# Patient Record
Sex: Male | Born: 1935 | Race: White | Hispanic: No | Smoking: Never smoker
Health system: Southern US, Community
[De-identification: ages and names within clinical notes are randomized; demographics above are authoritative.]

## PROBLEM LIST (undated history)

## (undated) DIAGNOSIS — F429 Obsessive-compulsive disorder, unspecified: Secondary | ICD-10-CM

## (undated) DIAGNOSIS — Z950 Presence of cardiac pacemaker: Secondary | ICD-10-CM

## (undated) DIAGNOSIS — Z22322 Carrier or suspected carrier of Methicillin resistant Staphylococcus aureus: Secondary | ICD-10-CM

## (undated) DIAGNOSIS — F0390 Unspecified dementia without behavioral disturbance: Secondary | ICD-10-CM

## (undated) DIAGNOSIS — I442 Atrioventricular block, complete: Secondary | ICD-10-CM

## (undated) DIAGNOSIS — I10 Essential (primary) hypertension: Secondary | ICD-10-CM

## (undated) DIAGNOSIS — R159 Full incontinence of feces: Secondary | ICD-10-CM

## (undated) HISTORY — PX: HERNIA REPAIR: SHX51

## (undated) HISTORY — DX: Obsessive-compulsive disorder, unspecified: F42.9

## (undated) HISTORY — DX: Atrioventricular block, complete: I44.2

## (undated) HISTORY — DX: Essential (primary) hypertension: I10

## (undated) HISTORY — DX: Unspecified dementia, unspecified severity, without behavioral disturbance, psychotic disturbance, mood disturbance, and anxiety: F03.90

## (undated) HISTORY — DX: Full incontinence of feces: R15.9

## (undated) HISTORY — DX: Presence of cardiac pacemaker: Z95.0

## (undated) HISTORY — DX: Carrier or suspected carrier of methicillin resistant Staphylococcus aureus: Z22.322

---

## 2007-10-04 ENCOUNTER — Ambulatory Visit: Payer: Self-pay | Admitting: Internal Medicine

## 2007-10-04 ENCOUNTER — Inpatient Hospital Stay (HOSPITAL_COMMUNITY): Admission: AD | Admit: 2007-10-04 | Discharge: 2007-10-09 | Payer: Self-pay | Admitting: Internal Medicine

## 2007-10-05 ENCOUNTER — Encounter: Payer: Self-pay | Admitting: Internal Medicine

## 2007-10-05 HISTORY — PX: OTHER SURGICAL HISTORY: SHX169

## 2007-10-29 ENCOUNTER — Ambulatory Visit: Payer: Self-pay

## 2008-01-07 ENCOUNTER — Ambulatory Visit: Payer: Self-pay | Admitting: Internal Medicine

## 2008-09-23 ENCOUNTER — Ambulatory Visit: Payer: Self-pay | Admitting: Internal Medicine

## 2008-12-26 IMAGING — CR DG CHEST 2V
2 series · 2 of 2 positions shown · non-contrast
Comparison: None.

CLINICAL DATA: Heart block.
 CHEST ? 2 VIEW:

[w chest pa]
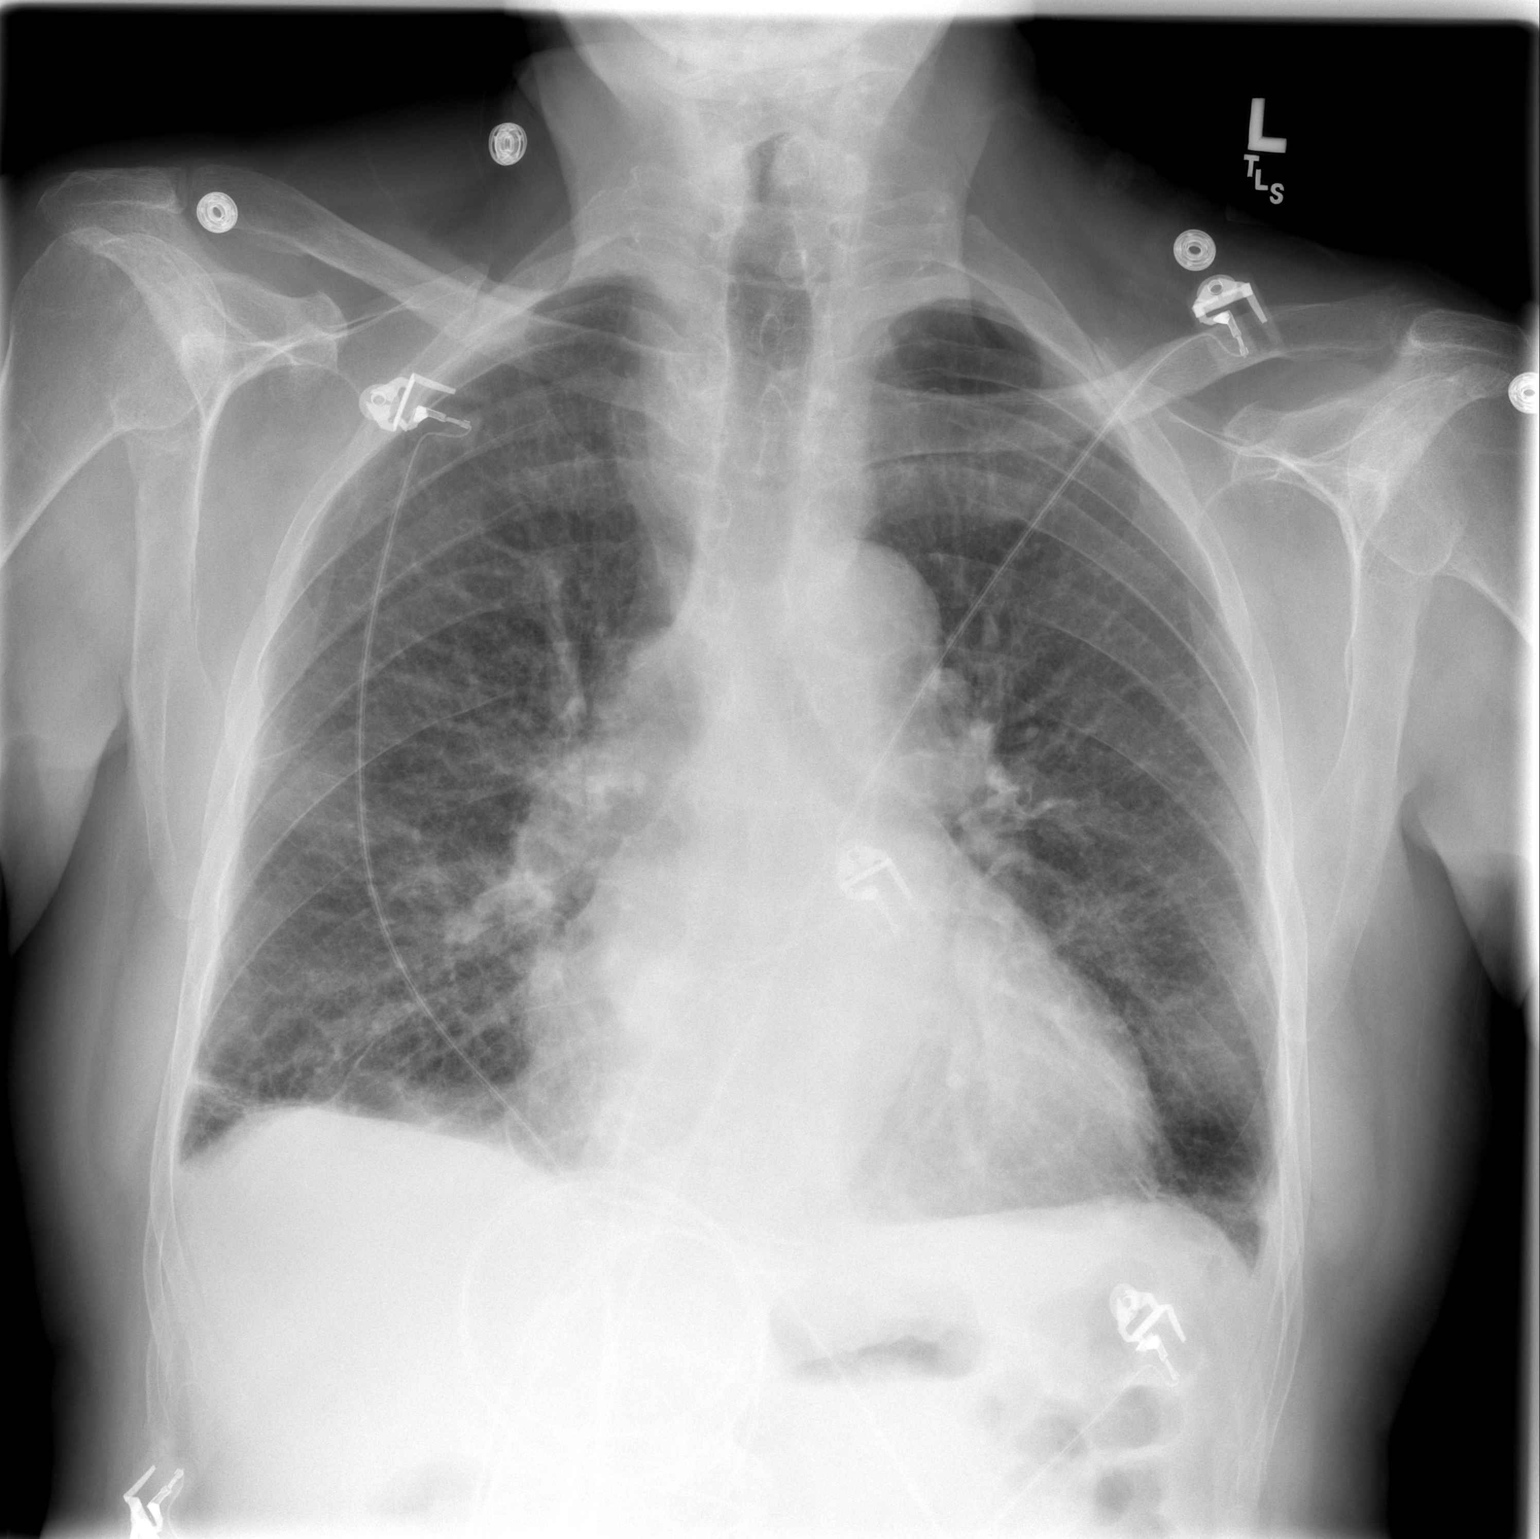

[w chest lat]
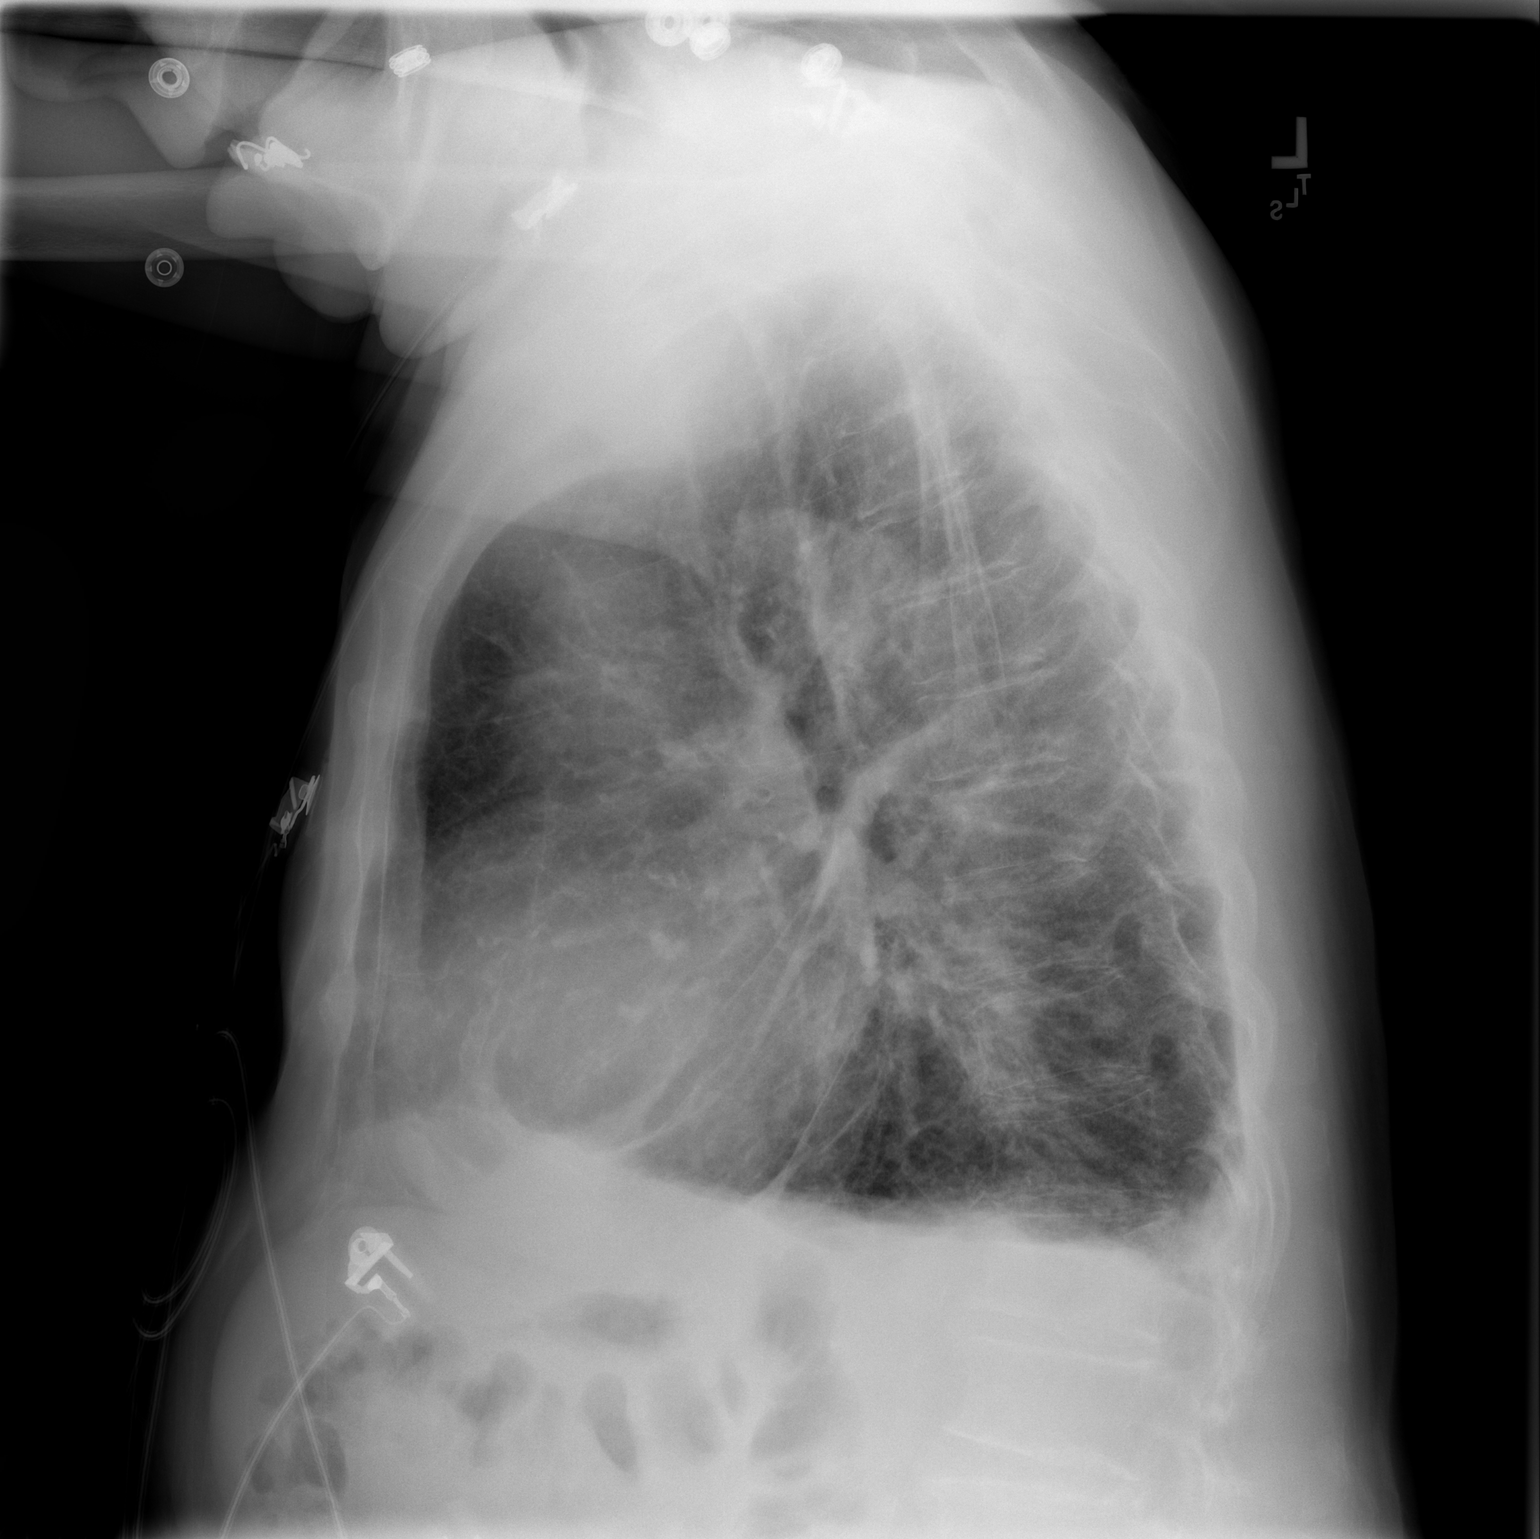

[2 of 2 positions shown; findings below may reference images not displayed]

FINDINGS: The heart is moderately enlarged.  Vascular congestion and interstitial edema are present.  Small bilateral effusions are present.  Right paratracheal density is noted, which may simply represent ectatic vasculature.  There are compression deformities at T12 and L1 of indeterminate age with osteopenia.  No pneumothorax.
IMPRESSION: 1.  Cardiomegaly with interstitial edema. 
 2.  Right paratracheal density, likely ectatic vasculature.  Comparison with prior films is recommended.  If not available, CT can be performed. 
 3.  Small bilateral effusions. 
 4.  T12 and L1 compression deformities of indeterminate age.

## 2008-12-31 ENCOUNTER — Ambulatory Visit: Payer: Self-pay | Admitting: Internal Medicine

## 2009-02-06 ENCOUNTER — Encounter (INDEPENDENT_AMBULATORY_CARE_PROVIDER_SITE_OTHER): Payer: Self-pay | Admitting: *Deleted

## 2009-04-02 DIAGNOSIS — I10 Essential (primary) hypertension: Secondary | ICD-10-CM | POA: Insufficient documentation

## 2009-04-02 DIAGNOSIS — I442 Atrioventricular block, complete: Secondary | ICD-10-CM

## 2009-04-02 DIAGNOSIS — A4902 Methicillin resistant Staphylococcus aureus infection, unspecified site: Secondary | ICD-10-CM | POA: Insufficient documentation

## 2009-04-14 ENCOUNTER — Ambulatory Visit: Payer: Self-pay | Admitting: Internal Medicine

## 2009-07-14 ENCOUNTER — Ambulatory Visit: Payer: Self-pay | Admitting: Internal Medicine

## 2009-07-15 ENCOUNTER — Encounter: Payer: Self-pay | Admitting: Internal Medicine

## 2009-07-20 ENCOUNTER — Encounter: Payer: Self-pay | Admitting: Internal Medicine

## 2009-10-06 ENCOUNTER — Ambulatory Visit: Payer: Self-pay | Admitting: Internal Medicine

## 2009-10-06 DIAGNOSIS — Z950 Presence of cardiac pacemaker: Secondary | ICD-10-CM | POA: Insufficient documentation

## 2010-01-06 ENCOUNTER — Ambulatory Visit: Payer: Self-pay | Admitting: Internal Medicine

## 2010-01-19 ENCOUNTER — Encounter: Payer: Self-pay | Admitting: Internal Medicine

## 2010-04-07 ENCOUNTER — Ambulatory Visit: Payer: Self-pay | Admitting: Internal Medicine

## 2010-04-22 ENCOUNTER — Encounter: Payer: Self-pay | Admitting: Internal Medicine

## 2010-06-30 ENCOUNTER — Ambulatory Visit: Payer: Self-pay | Admitting: Internal Medicine

## 2010-09-30 ENCOUNTER — Ambulatory Visit: Payer: Self-pay | Admitting: Internal Medicine

## 2010-11-02 ENCOUNTER — Encounter (INDEPENDENT_AMBULATORY_CARE_PROVIDER_SITE_OTHER): Payer: Self-pay | Admitting: *Deleted

## 2010-11-23 NOTE — Cardiovascular Report (Signed)
Summary: Office Visit Remote   Office Visit Remote   Imported By: Roderic Ovens 01/21/2010 11:50:12  _____________________________________________________________________  External Attachment:    Type:   Image     Comment:   External Document

## 2010-11-23 NOTE — Assessment & Plan Note (Signed)
Summary: 9 mo f/u   CC:  9 month f/u.  History of Present Illness: Warren Ritter returns today for followup.  He is a pleasant 77 yo man with a h/o CHB, s/p PPM, HTN and dementia.  He has been stable over the last 9 months denying syncope or sob or c/p.  His daughter who is with him today notes that his appetite is good and he follows her around constantly.  No peripheral edema or c/p.  Current Medications (verified): 1)  Aspirin 81 Mg Tbec (Aspirin) .... Take One Tablet By Mouth Daily 2)  Multivitamins  Tabs (Multiple Vitamin) .Marland Kitchen.. 1 By Mouth Once Daily 3)  Amlodipine Besylate 5 Mg Tabs (Amlodipine Besylate) .... Take One Tablet By Mouth Daily  Allergies (verified): 1)  ! * Benzoiazepine  Past History:  Past Medical History: Last updated: 04/02/2009 HYPERTENSION, BORDERLINE (ICD-401.9) ATRIOVENTRICULAR BLOCK, COMPLETE (ICD-426.0) MRSA ([id number])  Past Surgical History: Last updated: 04/02/2009  implantation of a Medtronic dual- chamber pacemaker  10/05/2007  Doylene Canning. Ladona Ridgel, MD  Review of Systems  The patient denies chest pain, syncope, dyspnea on exertion, and peripheral edema.    Vital Signs:  Patient profile:   77 year old male Height:      68 inches Weight:      168 pounds BMI:     25.64 Pulse rate:   64 / minute BP sitting:   138 / 60  (right arm) Cuff size:   regular  Vitals Entered By: Judithe Modest CMA (June 30, 2010 11:10 AM)  Physical Exam  General:  Well developed, well nourished, in no acute distress. Head:  normocephalic and atraumatic Eyes:  PERRLA/EOM intact; conjunctiva and lids normal. Mouth:  Teeth, gums and palate normal. Oral mucosa normal. Neck:  Neck supple, no JVD. No masses, thyromegaly or abnormal cervical nodes. Chest Wall:  Well healed PPM incision. Lungs:  Clear bilaterally to auscultation with no wheezes, rales, or rhonchi. Heart:  RRR with normal S1 and S2.  PMI is not enlarged or laterally displaced.  No murmur. Abdomen:   Bowel sounds positive; abdomen soft and non-tender without masses, organomegaly, or hernias noted. No hepatosplenomegaly. Msk:  Back normal, normal gait. Muscle strength and tone normal. Pulses:  pulses normal in all 4 extremities Extremities:  No clubbing or cyanosis. Neurologic:  Alert and oriented x 3.   PPM Specifications Following MD:  Lewayne Bunting, MD     PPM Vendor:  Medtronic     PPM Model Number:  [id number]     PPM Serial Number:  [id number] PPM DOI:  10/05/2007      Lead 1    Location: RA     DOI: 10/05/2007     Model #: [id number]     Serial #: [id number]     Status: active Lead 2    Location: RV     DOI: 10/05/2007     Model #: [id number]     Serial #: [id number]     Status: active   Indications:  CHB   PPM Follow Up Remote Check?  No Battery Voltage:  2.78 V     Battery Est. Longevity:  6 years     Pacer Dependent:  Yes       PPM Device Measurements Atrium  Amplitude: 2.0 mV, Impedance: 482 ohms, Threshold: 0.625 V at 0.4 msec Right Ventricle  Impedance: 484 ohms, Threshold: 0.5 V at 0.4 msec  Episodes MS Episodes:  0     Percent Mode Switch:  0     Coumadin:  No Ventricular High Rate:  3     Atrial Pacing:  96.7%     Ventricular Pacing:  99.8%  Parameters Mode:  DDDR     Lower Rate Limit:  60     Upper Rate Limit:  130 Paced AV Delay:  200     Sensed AV Delay:  180 Next Remote Date:  09/30/2010     Next Cardiology Appt Due:  06/25/2011 Tech Comments:  No parameter changes. Device function normal.  [facility] transmissions every 3 months.  ROV 1 year with Dr. Ladona Ridgel. Altha Harm, LPN  June 30, 2010 11:48 AM  MD Comments:  Agree with above.  Impression & Recommendations:  Problem # 1:  CARDIAC PACEMAKER IN SITU (ICD-V45.01) His device is working normally.  Underlying rhythm is CHB.  No escape.  Problem # 2:  HYPERTENSION, BORDERLINE (ICD-401.9) His blood pressure is improved from my last visit.  He will continue meds as below and a low sodium diet. His updated medication  list for this problem includes:    Aspirin 81 Mg Tbec (Aspirin) .Marland Kitchen... Take one tablet by mouth daily    Amlodipine Besylate 5 Mg Tabs (Amlodipine besylate) .Marland Kitchen... Take one tablet by mouth daily  Orders: EKG w/ Interpretation (93000)  Patient Instructions: 1)  Your physician recommends that you schedule a follow-up appointment in: YEAR WITH DR Ladona Ridgel 2)  Your physician recommends that you continue on your current medications as directed. Please refer to the Current Medication list given to you today.

## 2010-11-23 NOTE — Letter (Signed)
Summary: Remote Device Check  [employer], Main Office  [address]   [place], [place] [address]   Phone: [phone]  Fax: [phone]     January 19, 2010 MRN: [id number]   Warren Ritter [address] [place], [place]  [address]   Dear Mr. [facility],   Your remote transmission was recieved and reviewed by your physician.  All diagnostics were within normal limits for you.  __X___Your next transmission is scheduled for:   April 07, 2010.  Please transmit at any time this day.  If you have a wireless device your transmission will be sent automatically.     Sincerely,  Proofreader

## 2010-11-23 NOTE — Cardiovascular Report (Signed)
Summary: Office Visit   Office Visit   Imported By: Roderic Ovens 07/06/2010 12:00:19  _____________________________________________________________________  External Attachment:    Type:   Image     Comment:   External Document

## 2010-11-23 NOTE — Letter (Signed)
Summary: Remote Device Check  [employer], Main Office  [address]   [place], [place] [address]   Phone: [phone]  Fax: [phone]     April 22, 2010 MRN: [id number]   Warren Ritter [address] [place], [place]  [address]   Dear Mr. [facility],   Your remote transmission was recieved and reviewed by your physician.  All diagnostics were within normal limits for you.   __X____Your next office visit is scheduled for: September 2011 with Dr Ladona Ridgel.                               Please call our office to schedule an appointment.    Sincerely,  Vella Kohler

## 2010-11-23 NOTE — Cardiovascular Report (Signed)
Summary: Office Visit Remote   Office Visit Remote   Imported By: Roderic Ovens 04/23/2010 16:35:35  _____________________________________________________________________  External Attachment:    Type:   Image     Comment:   External Document

## 2010-11-25 NOTE — Letter (Signed)
Summary: Remote Device Check  [employer], Main Office  [address]   [place], [place] [address]   Phone: [phone]  Fax: [phone]     November 02, 2010 MRN: [id number]   [facility] [facility] [address] [place], [place]  [address]   Dear Mr. [facility],   Your remote transmission was recieved and reviewed by your physician.  All diagnostics were within normal limits for you.  __X___Your next transmission is scheduled for:   12-30-10.  Please transmit at any time this day.  If you have a wireless device your transmission will be sent automatically.   Sincerely,  Vella Kohler

## 2010-11-25 NOTE — Cardiovascular Report (Signed)
Summary: Office Visit Remote   Office Visit Remote   Imported By: Roderic Ovens 11/05/2010 12:06:37  _____________________________________________________________________  External Attachment:    Type:   Image     Comment:   External Document

## 2010-12-30 ENCOUNTER — Encounter (INDEPENDENT_AMBULATORY_CARE_PROVIDER_SITE_OTHER): Payer: Medicare Other

## 2010-12-30 ENCOUNTER — Encounter: Payer: Self-pay | Admitting: Internal Medicine

## 2010-12-30 DIAGNOSIS — I495 Sick sinus syndrome: Secondary | ICD-10-CM

## 2011-01-06 ENCOUNTER — Encounter: Payer: Self-pay | Admitting: *Deleted

## 2011-01-11 NOTE — Cardiovascular Report (Signed)
Summary: Office Visit   Office Visit   Imported By: Roderic Ovens 01/07/2011 14:44:55  _____________________________________________________________________  External Attachment:    Type:   Image     Comment:   External Document

## 2011-01-11 NOTE — Letter (Signed)
Summary: Remote Device Check  [facility] [employer], Main Office  [address]   [place], [place] [address]   Phone: [phone]  Fax: [phone]     January 06, 2011 MRN: [id number]   JACORY KAMEL [address] [place], [place]  [address]   Dear Mr. [facility],   Your remote transmission was recieved and reviewed by your physician.  All diagnostics were within normal limits for you.  __X___Your next transmission is scheduled for:  03/31/11.  Please transmit at any time this day.  If you have a wireless device your transmission will be sent automatically.  ______Your next office visit is scheduled for:                              . Please call our office to schedule an appointment.    Sincerely,  Altha Harm, LPN

## 2011-01-31 ENCOUNTER — Encounter: Payer: Self-pay | Admitting: Family Medicine

## 2011-01-31 NOTE — Progress Notes (Signed)
Subjective:    Patient ID: Warren Ritter, male    DOB: [birth year], 77 y.o.   MRN: [id number]  Diarrhea  This is a recurrent problem. The current episode started more than 1 month ago. The problem occurs 2 to 4 times per day. The problem has been waxing and waning. The stool consistency is described as watery. The patient states that diarrhea does not awaken him from sleep. Associated symptoms include weight loss. Pertinent negatives include no abdominal pain, arthralgias, bloating, chills, coughing, fever, headaches, increased  flatus, myalgias, sweats, URI or vomiting. Exacerbated by: medications: namenda and Citalopram. Risk factors include no known risk factors. He has tried anti-motility drug for the symptoms. The treatment provided moderate relief. There is no history of bowel resection, inflammatory bowel disease, irritable bowel syndrome, malabsorption, a recent abdominal surgery or short gut syndrome.    Past Medical History  Diagnosis Date  . Dementia   . Stool incontinence   . Hypertension   . OCD (obsessive compulsive disorder)   . Cardiac pacemaker   . Heart block AV third degree    Past Surgical History  Procedure Date  . Hernia repair     inguinal   History   Social History  . Marital Status: Widowed    Spouse Name: N/A    Number of Children: N/A  . Years of Education: N/A   Occupational History  . Not on file.   Social History Main Topics  . Smoking status: Never Smoker   . Smokeless tobacco: Not on file  . Alcohol Use: No  . Drug Use: No  . Sexually Active:    Other Topics Concern  . Not on file   Social History Narrative  . No narrative on file   No family history on file. No current outpatient prescriptions on file prior to visit.   No Known Allergies  There is no immunization history on file for this patient. Prior to Admission medications   Medication Sig Start Date End Date Taking? Authorizing Provider  amLODipine (NORVASC) 5 MG tablet Take 5  mg by mouth daily.     Yes Historical Provider, MD  aspirin 81 MG tablet Take 81 mg by mouth daily.     Yes Historical Provider, MD  multivitamin (ONE-A-DAY MEN'S) TABS Take 1 tablet by mouth daily.     Yes Historical Provider, MD    Review of Systems  Constitutional: Positive for weight loss and unexpected weight change. Negative for fever, chills, diaphoresis, activity change, appetite change and fatigue.  HENT: Negative.   Eyes: Negative.   Respiratory: Negative for cough.   Cardiovascular: Negative.   Gastrointestinal: Positive for diarrhea. Negative for nausea, vomiting, abdominal pain, constipation, blood in stool, abdominal distention, anal bleeding, rectal pain, bloating and flatus.  Musculoskeletal: Negative for myalgias and arthralgias.  Neurological: Negative for headaches.  Psychiatric/Behavioral: Positive for behavioral problems, confusion and decreased concentration. Negative for suicidal ideas, hallucinations, sleep disturbance, self-injury and dysphoric mood. The patient is hyperactive. The patient is not nervous/anxious.        [OCD was treated at mental health but daughter had to stop the citalopram, and the namenda due to diarrhea. Imodium-AD helped the diarrhea and  Today it is slowed down and better.  BP 123/65  Pulse 65  Temp 97.5 F (36.4 C)  Wt 157 lb (71.215 kg)    Objective:   Physical Exam  Constitutional: Vital signs are normal. He is cooperative.  Non-toxic appearance. He does not have a sickly  appearance. He does not appear ill. No distress.       Quiet man  HENT:  Head: Normocephalic and atraumatic.  Right Ear: External ear normal.  Left Ear: External ear normal.  Nose: Nose normal.  Mouth/Throat: Oropharynx is clear and moist.  Eyes: Pupils are equal, round, and reactive to light.  Neck: Normal range of motion. Neck supple. No tracheal deviation present. No thyromegaly present.  Cardiovascular: Normal heart sounds.   Pulmonary/Chest: Effort normal  and breath sounds normal.  Abdominal: Soft. Bowel sounds are normal. He exhibits no distension and no mass. There is no tenderness. There is no rebound and no guarding.  Musculoskeletal: Normal range of motion.  Lymphadenopathy:    He has no cervical adenopathy.  Neurological: He is alert. He has normal strength. No cranial nerve deficit or sensory deficit. He displays a negative Romberg sign.       Oriented to name, only. Mini mental status exam still 15/30  Skin: Skin is warm, dry and intact. He is not diaphoretic.  Psychiatric: His speech is normal. His mood appears not anxious. His affect is not angry, not blunt, not labile and not inappropriate. He is withdrawn. He is not agitated and not slowed. Thought content is not delusional. Cognition and memory are impaired. He expresses inappropriate judgment. He does not exhibit a depressed mood. He expresses no suicidal plans and no homicidal plans. He exhibits abnormal recent memory and abnormal remote memory.          Assessment & Plan:  1. Diarrhea, improved with discontinuation ofmedications. 2. OCD type behavior, treated at mental health 3. Dementia : diarrhea with namenda and citalopram. Will hold for  3 weeks. Daughter to call and consider at that time Exelon patches. 4. Stool incontinence. Evaluation by GI. Referral was made await evaluation. 5. Weight loss. Due to diarrhea.  Additional Plans: Patient has appointment this week with mental health. Increase po intake calories & meals. Home health intervention_ declined by daughter for now. Sign consent at Mental Health to share information. RTC x 6 weeks.

## 2011-03-07 ENCOUNTER — Other Ambulatory Visit: Payer: Self-pay | Admitting: Internal Medicine

## 2011-03-08 NOTE — H&P (Signed)
NAME:  Warren Ritter, LAMERE NO.:  [id number]   MEDICAL RECORD NO.:  [id number]          PATIENT TYPE:  INP   LOCATION:  3714                         FACILITY:  [facility]   PHYSICIAN:  Pricilla Riffle, MD, FACCDATE OF BIRTH:  02-24-1926   DATE OF ADMISSION:  10/04/2007  DATE OF DISCHARGE:                              HISTORY & PHYSICAL   IDENTIFICATION:  Warren Ritter is a 77 year old who we were asked to see by  Lindaann Pascal, Dr. Kathi Der office, for heart block.   HISTORY OF PRESENT ILLNESS:  The patient has no known history of cardiac  problems prior.  He was seen by Lindaann Pascal on Monday, December 8th, for  a cold.  EKG was done and it showed complete heart block at a rate of 42  beats per minute.  The patient denies any presyncope, syncope, no  shortness of breath, no chest pain, no PND, no fevers, chills.  He has a  nonproductive cough and given zMax x1 on Monday.   ALLERGIES:  None.   MEDICATIONS:  None.   PAST MEDICAL HISTORY:  1. MRSA of the right elbow after a fall in 2006.  2. Hernia surgery in 2000.   SOCIAL HISTORY:  Does not smoke, does not drink, widowed.   FAMILY HISTORY:  Brothers x3 with pacemakers.  Two other brothers alive  now.   REVIEW OF SYSTEMS:  Negative except for one spell this summer.  He  became weak and clammy while mowing the lawn.  He sat down for 20  minutes, okay.  Otherwise, again systems negative to all problems except  as noted above.   PHYSICAL EXAMINATION:  GENERAL:  The patient is in no acute distress.  VITAL SIGNS:  Blood pressure 164/72 (white coat per daughter), pulse is  40 and regular, weight 184.  HEENT:  Normocephalic atraumatic.  EOMI.  PERRL.  Mucous membranes  moist.  NECK:  No bruits, Cannon A waves.  JVP is normal.  LUNGS:  Clear.  No rales or wheezes.  Some rhonchi that clears with  cough.  CARDIAC:  Regular rate and rhythm.  S1 S2.  No S3 or murmurs.  ABDOMEN:  Supple, nontender, no hepatomegaly, no masses, normal  bowel  sounds.  EXTREMITIES:  No edema.  Pulses 2 plus.   A 12-lead EKG shows complete heart block with a ventricular rate of 40  beats per minute.  Right bundle morphology with QRS of 138 milliseconds.   IMPRESSION:  The patient is a 77 year old gentleman with complete heart  block.  He is relatively asymptomatic with EKG and increased QRS.   We will admit to [facility] for a pacemaker.  Daughter will  take since he has been asymptomatic.  Check admission labs, check  thyroid, chest x-ray on arrival, though it sounds like he had just had a  bronchitis.      Pricilla Riffle, MD, [facility]  Electronically Signed     PVR/MEDQ  D:  10/04/2007  T:  10/04/2007  Job:  [id number]

## 2011-03-08 NOTE — Assessment & Plan Note (Signed)
[facility]                         ELECTROPHYSIOLOGY OFFICE NOTE   BERNARDINO, DOWELL                         MRN:          [id number]  DATE:01/07/2008                            DOB:          February 20, 1926    [name] returns today for follow up.  He is a very pleasant elderly  male with a history of symptomatic bradycardia who has undergone  permanent pacemaker insertion back in December.  He returns today for  follow up.  In the interim, he has been stable.  Of course, in the  hospital he had a reaction to benzodiazepines which eventually resolved.  He denied chest pain or shortness of breath today.  He had no specific  complaints.   PHYSICAL EXAMINATION:  GENERAL:  He is a pleasant, well-appearing  elderly man in no acute distress.  VITAL SIGNS:  The blood pressure today was 140/64, the pulse 80 and  regular, respirations were 18.  The weight was 177 pounds.  NECK:  No jugular distention.  LUNGS:  Clear bilaterally to auscultation.  No wheezes, rales or rhonchi  are present.  CARDIOVASCULAR:  Regular rate and rhythm with normal S1-S2.  The  pacemaker insertion site was healed up very nicely.  EXTREMITIES:  No  cyanosis, clubbing or edema.   MEDICATIONS:  1. Aspirin 81 a day.  2. Multivitamin.   IMPRESSION:  1. Symptomatic bradycardia.  2. Status post pacemaker insertion.   DISCUSSION:  Overall, Mr. Cantera is doing amazingly well.  He denies  specific complaints today.  His pacemaker is working normally.   His P and R waves were 4 and 8 respectively.  The impedance is 630 and  500 respectively in the A and the V.  Threshold 0.75 at 0.4 in the A and  0.5 at 0.4 in the RV.  The battery voltage was 2.79 volts.  I will plan  to see the patient back in the office in 9 months, sooner should he have  additional problems or concerns.  Today, we turned his outputs down to  maximize battery longevity.     Doylene Canning. Ladona Ridgel, MD  Electronically  Signed    GWT/MedQ  DD: 01/07/2008  DT: 01/07/2008  Job #: [id number]   cc:   Ernestina Penna, M.D.

## 2011-03-08 NOTE — Op Note (Signed)
NAMETARANCE, BALAN NO.:  [id number]   MEDICAL RECORD NO.:  [id number]          PATIENT TYPE:  INP   LOCATION:  3714                         FACILITY:  [facility]   PHYSICIAN:  Doylene Canning. Ladona Ridgel, MD    DATE OF BIRTH:  25-Mar-1926   DATE OF PROCEDURE:  10/05/2007  DATE OF DISCHARGE:                               OPERATIVE REPORT   PROCEDURE PERFORMED:  Dual-chamber pacemaker implantation.   INDICATIONS:  Complete heart block.   INTRODUCTION:  The patient is a 77 year old male with a history of  complete heart block who was admitted to the hospital with additional  evaluation.  He had a wide QRS escape.   DESCRIPTION OF PROCEDURE:  After informed consent was obtained, the  patient was taken to the diagnostic EP lab in the fasting state.  After  the usual preparation and draping, intravenous fentanyl and midazolam  was given for sedation.  30 mL of lidocaine was infiltrated in the left  infraclavicular region.  A 5-cm incision was carried out over this  region, electrocautery was utilized to dissect down to the fascial  plane.  At this point, the patient became quite combative and we had to  further sedate him with Valium and fentanyl. Having accomplished this,  10 mL of contrast was injected into the left upper extremity venous  system demonstrating a patent left subclavian vein.  This was carried  out after several attempts to find the vein were unsuccessful.  The left  subclavian vein was then punctured x2 and the Medtronic model 5076, 58-  cm active fixation pacing lead serial number [id number] was advanced  into the right ventricle.  Medtronic model [id number], 52-cm active fixation  pacing lead serial number [id number] was then advanced into the right  atrium.  Mapping was carried out in the right ventricle for a final site  on the RV apical septum. The R-waves measured 9 and the impedance was  930 ohms, threshold 0.7 volts at 0.5 milliseconds.  10 volt pacing did  not  stimulate the diaphragm.  With the ventricular lead in satisfactory  position, attention then turned to placement of the atrial lead and was  placed in the anterolateral wall of the right atrium where P-waves were  4 and the impedance 571 ohms, the threshold a volt at 0.5 and there is a  nice injury current with active fixation.  Again 10 volts pacing did not  stimulate the diaphragm.  With both atrial and ventricular lead in  satisfactory position, they were secured to the subpectoralis fascia  with figure-of-eight silk suture.  The sewing sleeve was also secured  with silk suture.  Electrocautery was then utilized to make a  subcutaneous pocket.  Kanamycin irrigation was utilized to irrigate the  pocket and electrocautery was utilized to assure hemostasis.  The  Medtronic Sensia dual chamber pacemaker serial number [id number] was  connected to the atrial and ventricular lead and placed back in the  subcutaneous pocket. The generator was secured with silk suture.  Additional kanamycin was utilized to irrigate the pocket and the  incision closed with a layer of 2-0 Vicryl followed by a layer of 3-0  Vicryl. Benzoin was painted on the skin, Steri-Strips were applied, a  pressure dressing was placed and the patient was returned to his room in  satisfactory condition.   COMPLICATIONS:  There were no immediate procedure complications.   RESULTS:  This demonstrates successful implantation of a Medtronic dual-  chamber pacemaker in a patient with complete heart block.      Doylene Canning. Ladona Ridgel, MD  Electronically Signed     GWT/MEDQ  D:  10/05/2007  T:  10/06/2007  Job:  [id number]   cc:   Ernestina Penna, M.D.

## 2011-03-08 NOTE — Assessment & Plan Note (Signed)
[facility]                         ELECTROPHYSIOLOGY OFFICE NOTE   IVAN, LACHER                         MRN:          [id number]  DATE:09/23/2008                            DOB:          November 13, 1925    Mr. Yeley returns today for followup.  He is a very pleasant elderly  male with a history of complete heart block status post pacemaker  insertion.  He had no specific complaints today.  His daughter is with  him and states that he has become fairly sedentary.  She thinks he is  concerned that activity might mess up his pacemaker.  No other  complaints today.   His medicines include aspirin 81 a day and a multivitamin.   PHYSICAL EXAMINATION:  GENERAL:  He is a pleasant well-appearing elderly  man in no distress.  Blood pressure today was 150/80, the pulse 80 and  regular, the respirations were 18, weight was 174 pounds.  NECK:  No jugular venous distention.  LUNGS:  Clear bilaterally to auscultation.  No wheezes, rales, or  rhonchi are present.  There is no increased work of breathing.  CARDIOVASCULAR:  Regular rate and rhythm.  Normal S1 and S2.  No  murmurs, rubs, or gallops.  LUNGS:  Clear bilaterally to auscultation.  No wheezes, rales, or  rhonchi are present.  ABDOMEN:  Soft, nontender.  EXTREMITIES:  No edema.   Interrogation of the pacemaker demonstrates a Medtronic [place].  P-  waves are greater than 2.8.  There are no R-waves secondary to complete  heart block.  The patient impedance was 590 in A, 559 in the V.  The  threshold was 0.5 at 0.4 in the A and 0.75 at 0.4 in the RV.  The  battery voltage was 2.79 volts.  He was 99.9% V-paced, 74% A-paced.   IMPRESSION:  1. Symptomatic bradycardia.  2. Complete heart block.  3. Status post pacemaker insertion.   DISCUSSION:  Mr. Alyse Low is stable.  His pacemaker is working normally.  We will see him back for pacemaker followup in 1 year.     Doylene Canning. Ladona Ridgel, MD  Electronically  Signed    GWT/MedQ  DD: 09/23/2008  DT: 09/24/2008  Job #: [id number]   cc:   Ernestina Penna, M.D.

## 2011-03-08 NOTE — Discharge Summary (Signed)
NAMEHARRIE, Warren Ritter NO.:  [id number]   MEDICAL RECORD NO.:  [id number]          PATIENT TYPE:  INP   LOCATION:  4715                         FACILITY:  [facility]   PHYSICIAN:  Maple Mirza, PA   DATE OF BIRTH:  19-Aug-1926   DATE OF ADMISSION:  10/04/2007  DATE OF DISCHARGE:  10/09/2007                               DISCHARGE SUMMARY   ALLERGIES:  NO KNOWN DRUG ALLERGIES.   This dictation and exam greater than 35 minutes.   FINAL DIAGNOSES:  1. Discharging day number 4 status post implant of a Medtronic SENSIA      dual-chamber permanent pacemaker, Dr. Lewayne Bunting.  2. Complete heart block with symptoms of fatigue only.  No syncope or      presyncope.  3. Borderline hypertension this admission, patient discharging on no      antihypertensives.  4. Delirium postprocedure clearing after 40 hours of active sedation      in the cardiac intensive care unit.   SECONDARY DIAGNOSES:  1. Methicillin-resistant Staphylococcus aureus of the right elbow      after a fall 2006.  2. Hernia surgery 2000.   PROCEDURE:  October 05, 2007, implant of Medtronic SENSIA dual-chamber  pacemaker.  The patient has had no hematoma post-procedurally.  The  device has been interrogated.  All values within normal limits.  Mobility of the left arm and incision care have been discussed with the  patient and his daughter prior to discharge.   BRIEF HISTORY:  Mr. Rowand is a 77 year old male referred by Dr. Kathi Der  office for heart block.   The patient has no known history of cardiac problems.  He was seen by  Lindaann Pascal Monday, October 01, 2007.  Electrocardiogram was done and  showed complete heart block with rate of 42 beats per minute.  The  patient denied any presyncopal or syncopal symptoms.  He was not short  of breath.  He had no chest pain.  No paroxysmal nocturnal dyspnea.  He  is not having fevers or chills.  He has a nonproductive cough and has  been given a Z-Pak.   HOSPITAL COURSE:  The patient was admitted from the [facility]  Office on [facility] October 04, 2007 after being seen by Dr. Dietrich Pates and with discovery of complete  heart block.  The patient was seen  at [facility] by Dr. Lewayne Bunting.  The risks, benefits, goals  and expectation of pacemaker have been described to the patient and he  wishes to proceed.  This procedure was done on October 05, 2007.  A  Medtronic dual-chamber pacemaker was implanted.  The patient was  confused and combative upon awakening in the electrophysiology lab.  He  received Haldol with limited efficacy.  He was transferred to the  cardiac intensive care unit where a Foley catheter was placed and the  patient was given Ativan until sedated.  His delirium slowly abated with  continued dosing of Ativan and Haldol as needed and required.  The  patient's mental status gradually has returned  to baseline at the time  of discharge.  He is ambulating with assist x 2 340 feet.   Several things have been accomplished since the patient's admission:  He  had an echocardiogram on October 05, 2007 which showed ejection  fraction of 50% with mild mitral regurgitation.  No aortic stenosis,  mild-moderate pulmonic regurgitation and mild tricuspid regurgitation.  The patient also had some diarrhea and fever on postprocedure day number  2.  Urinalysis at that time was negative.  C. diff. assay was negative  and the patient's diarrhea was treated with Imodium and has resolved.  The patient also had a physical therapy consult and cardiac rehab  consults for debilitation and to assist the patient as his mental status  improved.  Physical therapy recommended a rolling walker for home use  and this has been obtained at the time of discharge.  They also  recommended home health physical therapy which has been arranged with  [facility].   DISCHARGE MEDICATIONS:  Enteric-coated aspirin 81 mg daily.    FOLLOW UP:  1. Proviso that the patient follow up at [facility] and perhaps he could benefit from low-level      antihypertensive therapy.  2. He has followup at [facility], [address] in      [place] at the pacer clinic Monday October 29, 2007 at 9:20.  3. He will see Dr. Ladona Ridgel in 3 months.  That appointment is pending at      this time, but it will be in March.  We are not sure whether it is      going to be in [place] or in [place], but this will be      arranged prior to the patient's discharge.   LABORATORY DATA:  Laboratory studies pertinent to this admission were  drawn October 07, 2007.  They include a complete blood count:  White  cells 9.8, hemoglobin 14.3, hematocrit 42.4, platelets 1500.  Serum  electrolytes on October 07, 2007:  Sodium is 135, potassium 4.4,  chloride 103, carbonate 22, BUN 15, creatinine 0.76, glucose 111, TSH  this admission 1.920.  Once again, diarrhea with C. diff. negative assay  which has now resolved.      Maple Mirza, PA     GM/MEDQ  D:  10/09/2007  T:  10/09/2007  Job:  [id number]   cc:   Doylene Canning. Ladona Ridgel, MD  Ernestina Penna, M.D.

## 2011-03-31 ENCOUNTER — Ambulatory Visit (INDEPENDENT_AMBULATORY_CARE_PROVIDER_SITE_OTHER): Payer: Medicare Other | Admitting: *Deleted

## 2011-03-31 ENCOUNTER — Other Ambulatory Visit: Payer: Self-pay | Admitting: Internal Medicine

## 2011-03-31 DIAGNOSIS — I442 Atrioventricular block, complete: Secondary | ICD-10-CM

## 2011-04-07 NOTE — Progress Notes (Signed)
Pacer remote check  

## 2011-04-15 ENCOUNTER — Encounter: Payer: Self-pay | Admitting: *Deleted

## 2011-06-08 ENCOUNTER — Encounter: Payer: Self-pay | Admitting: Internal Medicine

## 2011-07-01 ENCOUNTER — Encounter: Payer: Self-pay | Admitting: Internal Medicine

## 2011-07-01 ENCOUNTER — Ambulatory Visit (INDEPENDENT_AMBULATORY_CARE_PROVIDER_SITE_OTHER): Payer: Medicare Other | Admitting: Internal Medicine

## 2011-07-01 DIAGNOSIS — I442 Atrioventricular block, complete: Secondary | ICD-10-CM

## 2011-07-01 DIAGNOSIS — I10 Essential (primary) hypertension: Secondary | ICD-10-CM

## 2011-07-01 DIAGNOSIS — Z950 Presence of cardiac pacemaker: Secondary | ICD-10-CM

## 2011-07-01 LAB — PACEMAKER DEVICE OBSERVATION
AL AMPLITUDE: 4 mv
AL THRESHOLD: 0.5 V
ATRIAL PACING PM: 79
BATTERY VOLTAGE: 2.78 V
RV LEAD IMPEDENCE PM: 469 Ohm

## 2011-07-01 NOTE — Assessment & Plan Note (Signed)
His blood pressure appears to be well controlled. He will continue his current medical therapy. 

## 2011-07-01 NOTE — Assessment & Plan Note (Signed)
His device is working normally. We'll plan to recheck in several months. 

## 2011-07-01 NOTE — Progress Notes (Signed)
HPI [name] returns today for followup. He is a pleasant demented elderly man with a history of symptomatic complete heart block status post permanent pacemaker insertion. The patient has continued to worsen from a memory perspective. His daughter who is with him today states that he also experiences periods of agitation particularly at nighttime. He's also had problems with fecal incontinence. He has not had syncope and denies chest pain or shortness of breath. No Known Allergies   Current Outpatient Prescriptions  Medication Sig Dispense Refill  . amLODipine (NORVASC) 5 MG tablet TAKE ONE TABLET BY MOUTH ONE TIME DAILY  90 tablet  0  . aspirin 81 MG tablet Take 81 mg by mouth daily.        . DimenhyDRINATE (DRAMAMINE PO) Take by mouth as needed.        . Loperamide HCl (IMODIUM PO) Take by mouth as needed.        . multivitamin (ONE-A-DAY MEN'S) TABS Take 1 tablet by mouth daily.        Marland Kitchen valproic acid (DEPAKENE) 250 MG capsule Take 250 mg by mouth 3 (three) times daily.           Past Medical History  Diagnosis Date  . Dementia   . Stool incontinence   . Hypertension   . OCD (obsessive compulsive disorder)   . Cardiac pacemaker   . Heart block AV third degree   . MRSA (methicillin resistant staph aureus) culture positive     ROS:   All systems reviewed and negative except as noted in the HPI.   Past Surgical History  Procedure Date  . Hernia repair     inguinal  . Implantation of a medtronic dual-chamber pacemaker 10/05/2007    Dr. Lewayne Bunting     No family history on file.   History   Social History  . Marital Status: Widowed    Spouse Name: N/A    Number of Children: N/A  . Years of Education: N/A   Occupational History  . Not on file.   Social History Main Topics  . Smoking status: Never Smoker   . Smokeless tobacco: Not on file  . Alcohol Use: No  . Drug Use: No  . Sexually Active:    Other Topics Concern  . Not on file   Social History  Narrative  . No narrative on file     BP 100/58  Pulse 83  Ht 5\' 9"  (1.753 m)  Wt 154 lb (69.854 kg)  BMI 22.74 kg/m2  Physical Exam:  Well appearing elderly man, NAD HEENT: Unremarkable Neck:  No JVD, no thyromegally Lymphatics:  No adenopathy Back:  No CVA tenderness Lungs:  Clear. Well-healed pacemaker incision. HEART:  Regular rate rhythm, no murmurs, no rubs, no clicks Abd:  soft, positive bowel sounds, no organomegally, no rebound, no guarding Ext:  2 plus pulses, no edema, no cyanosis, no clubbing Skin:  No rashes no nodules Neuro:  CN II through XII intact, motor grossly intact  DEVICE  Normal device function.  See PaceArt for details.   Assess/Plan:

## 2011-07-01 NOTE — Patient Instructions (Signed)
Your physician wants you to follow-up in: 12 months with Dr Taylor You will receive a reminder letter in the mail two months in advance. If you don't receive a letter, please call our office to schedule the follow-up appointment.   Remote monitoring is used to monitor your Pacemaker of ICD from home. This monitoring reduces the number of office visits required to check your device to one time per year. It allows us to keep an eye on the functioning of your device to ensure it is working properly. You are scheduled for a device check from home on 09/29/2011. You may send your transmission at any time that day. If you have a wireless device, the transmission will be sent automatically. After your physician reviews your transmission, you will receive a postcard with your next transmission date.   

## 2011-08-01 LAB — BASIC METABOLIC PANEL
CO2: 22
Calcium: 8.5
Chloride: 103
Creatinine, Ser: 0.76
GFR calc Af Amer: >60
GFR calc non Af Amer: >60
Potassium: 4.4

## 2011-08-01 LAB — CARDIAC PANEL(CRET KIN+CKTOT+MB+TROPI)
Relative Index: 1.6
Relative Index: 1.8
Troponin I: 0.05

## 2011-08-01 LAB — CLOSTRIDIUM DIFFICILE EIA: C difficile Toxins A+B, EIA: NEGATIVE

## 2011-08-01 LAB — URINALYSIS, ROUTINE W REFLEX MICROSCOPIC
Glucose, UA: NEGATIVE
Ketones, ur: 40 — AB
Leukocytes, UA: NEGATIVE
Nitrite: NEGATIVE
Specific Gravity, Urine: 1.025
Urobilinogen, UA: 1
pH: 6

## 2011-08-01 LAB — CBC
HCT: 39.7
Hemoglobin: 13.1
MCHC: 33.1
MCHC: 33.7
MCV: 96.5
Platelets: 189
RBC: 4.12 — ABNORMAL LOW
RDW: 13.7
WBC: 8.7

## 2011-08-01 LAB — COMPREHENSIVE METABOLIC PANEL
ALT: 29
AST: 33
CO2: 24
GFR calc Af Amer: >60
GFR calc non Af Amer: >60

## 2011-08-01 LAB — URINE MICROSCOPIC-ADD ON

## 2011-08-01 LAB — PROTIME-INR: INR: 1.1

## 2011-09-02 ENCOUNTER — Other Ambulatory Visit: Payer: Self-pay

## 2011-09-02 MED ORDER — AMLODIPINE BESYLATE 5 MG PO TABS: 5.0000 mg | ORAL_TABLET | Freq: Every day | ORAL | Status: DC

## 2011-09-29 ENCOUNTER — Ambulatory Visit (INDEPENDENT_AMBULATORY_CARE_PROVIDER_SITE_OTHER): Payer: Medicare Other | Admitting: *Deleted

## 2011-09-29 ENCOUNTER — Encounter: Payer: Self-pay | Admitting: Internal Medicine

## 2011-09-29 ENCOUNTER — Other Ambulatory Visit: Payer: Self-pay | Admitting: Internal Medicine

## 2011-09-29 DIAGNOSIS — I442 Atrioventricular block, complete: Secondary | ICD-10-CM

## 2011-10-02 LAB — REMOTE PACEMAKER DEVICE: BATTERY VOLTAGE: 2.77 V

## 2011-10-13 NOTE — Progress Notes (Signed)
Remote pacer check  

## 2011-10-26 ENCOUNTER — Encounter: Payer: Self-pay | Admitting: *Deleted

## 2012-01-05 ENCOUNTER — Encounter: Payer: Medicare Other | Admitting: *Deleted

## 2012-01-09 ENCOUNTER — Encounter: Payer: Self-pay | Admitting: *Deleted

## 2012-03-16 ENCOUNTER — Telehealth: Payer: Self-pay | Admitting: Internal Medicine

## 2012-03-16 NOTE — Telephone Encounter (Signed)
New msg Pt's daughter wants to know when next remote transmission will be

## 2012-03-19 ENCOUNTER — Encounter: Payer: Self-pay | Admitting: *Deleted

## 2012-03-19 NOTE — Telephone Encounter (Signed)
Spoke with daughter.  Aware remote transmission due 6-20 with follow in September with Dr Ladona Ridgel in [facility] office.

## 2012-04-12 ENCOUNTER — Ambulatory Visit (INDEPENDENT_AMBULATORY_CARE_PROVIDER_SITE_OTHER): Payer: Medicare Other | Admitting: *Deleted

## 2012-04-12 ENCOUNTER — Encounter: Payer: Self-pay | Admitting: Internal Medicine

## 2012-04-12 DIAGNOSIS — Z950 Presence of cardiac pacemaker: Secondary | ICD-10-CM

## 2012-04-12 DIAGNOSIS — I442 Atrioventricular block, complete: Secondary | ICD-10-CM

## 2012-04-23 LAB — REMOTE PACEMAKER DEVICE
AL THRESHOLD: 0.625 V
BAMS-0001: 175 {beats}/min
BATTERY VOLTAGE: 2.76 V
RV LEAD IMPEDENCE PM: 453 Ohm
RV LEAD THRESHOLD: 0.5 V
VENTRICULAR PACING PM: 99

## 2012-05-18 ENCOUNTER — Encounter: Payer: Self-pay | Admitting: *Deleted

## 2012-07-09 ENCOUNTER — Ambulatory Visit (INDEPENDENT_AMBULATORY_CARE_PROVIDER_SITE_OTHER): Payer: Medicare Other | Admitting: Internal Medicine

## 2012-07-09 ENCOUNTER — Encounter: Payer: Self-pay | Admitting: Internal Medicine

## 2012-07-09 VITALS — BP 112/72 | HR 72 | Ht 69.0 in | Wt 155.0 lb

## 2012-07-09 DIAGNOSIS — I442 Atrioventricular block, complete: Secondary | ICD-10-CM

## 2012-07-09 DIAGNOSIS — Z950 Presence of cardiac pacemaker: Secondary | ICD-10-CM

## 2012-07-09 DIAGNOSIS — I10 Essential (primary) hypertension: Secondary | ICD-10-CM

## 2012-07-09 LAB — PACEMAKER DEVICE OBSERVATION
AL IMPEDENCE PM: 483 Ohm
AL THRESHOLD: 0.625 V
ATRIAL PACING PM: 81.1

## 2012-07-09 NOTE — Assessment & Plan Note (Signed)
His medtronic dual chamber PPM is working normally. Will recheck in several months. 

## 2012-07-09 NOTE — Assessment & Plan Note (Signed)
His blood pressure is well controlled on his current medical therapy. Will follow. He will maintain a low sodium diet.

## 2012-07-09 NOTE — Progress Notes (Signed)
HPI Mr. Groft returns today for followup. He is a pleasant elderly man with symptomatic bradycardia, s/p PPM. He also has a h/o dementia and HTN. He has OCD and a h/o incontinence. In the interim, he denies chest pain, sob, or peripheral edema. His mood has been stable though he does have occasional episodes where he gets confused. No Known Allergies   Current Outpatient Prescriptions  Medication Sig Dispense Refill  . amLODipine (NORVASC) 5 MG tablet Take 1 tablet (5 mg total) by mouth daily.  90 tablet  4  . aspirin 81 MG tablet Take 81 mg by mouth daily.        . DimenhyDRINATE (DRAMAMINE PO) Take by mouth as needed.        . Loperamide HCl (IMODIUM PO) Take by mouth as needed.        Marland Kitchen LORazepam (ATIVAN) 0.5 MG tablet Take 0.5 mg by mouth as needed.         Past Medical History  Diagnosis Date  . Dementia   . Stool incontinence   . Hypertension   . OCD (obsessive compulsive disorder)   . Cardiac pacemaker   . Heart block AV third degree   . MRSA (methicillin resistant staph aureus) culture positive     ROS:   All systems reviewed and negative except as noted in the HPI.   Past Surgical History  Procedure Date  . Hernia repair     inguinal  . Implantation of a medtronic dual-chamber pacemaker 10/05/2007    Dr. Lewayne Bunting     No family history on file.   History   Social History  . Marital Status: Widowed    Spouse Name: N/A    Number of Children: N/A  . Years of Education: N/A   Occupational History  . Not on file.   Social History Main Topics  . Smoking status: Never Smoker   . Smokeless tobacco: Not on file  . Alcohol Use: No  . Drug Use: No  . Sexually Active:    Other Topics Concern  . Not on file   Social History Narrative  . No narrative on file     BP 112/72  Pulse 72  Ht 5\' 9"  (1.753 m)  Wt 155 lb (70.308 kg)  BMI 22.89 kg/m2  Physical Exam:  Well appearing elderly man, NAD HEENT: Unremarkable Neck:  No JVD, no  thyromegally Lungs:  Clear with no wheezes, rales, or rhonchi.Well healed PPM incision. HEART:  Regular rate rhythm, no murmurs, no rubs, no clicks Abd:  soft, positive bowel sounds, no organomegally, no rebound, no guarding Ext:  2 plus pulses, no edema, no cyanosis, no clubbing Skin:  No rashes no nodules Neuro:  CN II through XII intact, motor grossly intact  DEVICE  Normal device function.  See PaceArt for details.   Assess/Plan:

## 2012-07-09 NOTE — Patient Instructions (Addendum)
[facility] 10/15/12.   Your physician wants you to follow-up in: 1 year. You will receive a reminder letter in the mail two months in advance. If you don't receive a letter, please call our office to schedule the follow-up appointment.      Continue same medication

## 2012-10-15 ENCOUNTER — Encounter: Payer: Self-pay | Admitting: Internal Medicine

## 2012-10-15 ENCOUNTER — Ambulatory Visit (INDEPENDENT_AMBULATORY_CARE_PROVIDER_SITE_OTHER): Payer: Medicare Other | Admitting: *Deleted

## 2012-10-15 DIAGNOSIS — Z950 Presence of cardiac pacemaker: Secondary | ICD-10-CM

## 2012-10-15 DIAGNOSIS — I442 Atrioventricular block, complete: Secondary | ICD-10-CM

## 2012-10-22 LAB — REMOTE PACEMAKER DEVICE
ATRIAL PACING PM: 84
BATTERY VOLTAGE: 2.75 V
VENTRICULAR PACING PM: 100

## 2012-10-26 ENCOUNTER — Encounter: Payer: Self-pay | Admitting: *Deleted

## 2012-11-21 ENCOUNTER — Other Ambulatory Visit: Payer: Self-pay | Admitting: Internal Medicine

## 2013-01-21 ENCOUNTER — Ambulatory Visit (INDEPENDENT_AMBULATORY_CARE_PROVIDER_SITE_OTHER): Payer: Medicare Other | Admitting: *Deleted

## 2013-01-21 DIAGNOSIS — Z950 Presence of cardiac pacemaker: Secondary | ICD-10-CM

## 2013-01-21 DIAGNOSIS — I442 Atrioventricular block, complete: Secondary | ICD-10-CM

## 2013-01-23 ENCOUNTER — Other Ambulatory Visit: Payer: Self-pay | Admitting: Internal Medicine

## 2013-01-27 LAB — REMOTE PACEMAKER DEVICE
AL THRESHOLD: 0.75 V
BAMS-0001: 175 {beats}/min
BATTERY VOLTAGE: 2.75 V
RV LEAD THRESHOLD: 0.625 V

## 2013-02-19 ENCOUNTER — Encounter: Payer: Self-pay | Admitting: *Deleted

## 2013-02-20 ENCOUNTER — Encounter: Payer: Self-pay | Admitting: Internal Medicine

## 2013-02-25 ENCOUNTER — Ambulatory Visit (INDEPENDENT_AMBULATORY_CARE_PROVIDER_SITE_OTHER): Payer: Medicare Other | Admitting: Family Medicine

## 2013-02-25 ENCOUNTER — Encounter: Payer: Self-pay | Admitting: Family Medicine

## 2013-02-25 VITALS — BP 125/71 | HR 80 | Temp 97.0°F | Wt 152.0 lb

## 2013-02-25 DIAGNOSIS — Z950 Presence of cardiac pacemaker: Secondary | ICD-10-CM

## 2013-02-25 DIAGNOSIS — I10 Essential (primary) hypertension: Secondary | ICD-10-CM

## 2013-02-25 DIAGNOSIS — R1013 Epigastric pain: Secondary | ICD-10-CM | POA: Insufficient documentation

## 2013-02-25 DIAGNOSIS — I442 Atrioventricular block, complete: Secondary | ICD-10-CM

## 2013-02-25 DIAGNOSIS — R079 Chest pain, unspecified: Secondary | ICD-10-CM | POA: Insufficient documentation

## 2013-02-25 DIAGNOSIS — K3189 Other diseases of stomach and duodenum: Secondary | ICD-10-CM

## 2013-02-25 MED ORDER — PANTOPRAZOLE SODIUM 40 MG PO TBEC: 40.0000 mg | DELAYED_RELEASE_TABLET | Freq: Every day | ORAL | Status: DC

## 2013-02-25 MED ORDER — LORAZEPAM 1 MG PO TABS: 1.0000 mg | ORAL_TABLET | ORAL | Status: DC | PRN

## 2013-02-25 NOTE — Progress Notes (Signed)
Subjective:     Patient ID: Warren Ritter, male   DOB: [birth year], 79 y.o.   MRN: [id number]  HPI Came for follow up. Ongoing stool incontinence and loose stools. Sometimes has stomach aches. No fever. No nausea , no vomiting. Un related to meals. His dementia is ongoing. Daughter takes care of him and requests only conservative care. With his advanced dementia she didn't think that we should be aggressive in the workup and treatment. Daughter wonders if his symptoms is due to the extra aspirin she ha sgiven him because he slipped and fell recently.  Past Medical History  Diagnosis Date  . Dementia   . Stool incontinence   . Hypertension   . OCD (obsessive compulsive disorder)   . Cardiac pacemaker   . Heart block AV third degree   . MRSA (methicillin resistant staph aureus) culture positive    Past Surgical History  Procedure Laterality Date  . Hernia repair      inguinal  . Implantation of a medtronic dual-chamber pacemaker  10/05/2007    Dr. Lewayne Bunting   Current Outpatient Prescriptions on File Prior to Visit  Medication Sig Dispense Refill  . amLODipine (NORVASC) 5 MG tablet TAKE ONE TABLET BY MOUTH ONE TIME DAILY  90 tablet  3  . DimenhyDRINATE (DRAMAMINE PO) Take by mouth as needed.        . Loperamide HCl (IMODIUM PO) Take by mouth as needed.        Marland Kitchen aspirin 81 MG tablet Take 81 mg by mouth daily.         No current facility-administered medications on file prior to visit.   No Known Allergies  There is no immunization history on file for this patient. History   Social History  . Marital Status: Widowed    Spouse Name: N/A    Number of Children: N/A  . Years of Education: N/A   Occupational History  . Not on file.   Social History Main Topics  . Smoking status: Never Smoker   . Smokeless tobacco: Not on file  . Alcohol Use: No  . Drug Use: No  . Sexually Active:    Other Topics Concern  . Not on file   Social History Narrative  . No narrative on file    Review of Systems  Gastrointestinal: Positive for abdominal pain and diarrhea. Negative for nausea, vomiting, constipation, blood in stool, abdominal distention, anal bleeding and rectal pain.  All other systems reviewed and are negative.      Objective:   Physical Exam    APPEARANCE:  Pleasant white male Patient in no acute distress.The patient appeared well nourished and normally developed. Acyanotic. Waist: VITAL SIGNS:BP 125/71  Pulse 80  Temp(Src) 97 F (36.1 C) (Oral)  Wt 152 lb (68.947 kg)  BMI 22.44 kg/m2   SKIN: warm and  Dry without overt rashes, tattoos and scars  HEAD and Neck: without JVD, Head and scalp: normal Eyes:No scleral icterus. Fundi normal, eye movements normal. Ears: Auricle normal, canal normal, Tympanic membranes normal, insufflation normal. Nose: normal Throat: normal Neck & thyroid: normal  CHEST & LUNGS: Chest wall: normal Lungs: Clear  CVS: Reveals the PMI to be normally located. Regular rhythm, First and Second Heart sounds are normal,  absence of murmurs, rubs or gallops. Peripheral vasculature: Radial pulses: normal Dorsal pedis pulses: normal Posterior pulses: normal  ABDOMEN:  Appearance: normal Benign,, no organomegaly, no masses, no Abdominal Aortic enlargement. No Guarding , no rebound. No Bruits.  Bowel sounds: normal  EXTREMETIES: nonedematous.  MUSCULOSKELETAL:  Spine: normal Joints: intact  NEUROLOGIC: oriented to person; nonfocal in extremities Strength is normal   Assessment:     Chest pain - Plan: EKG 12-Lead  Dyspepsia - Plan: pantoprazole (PROTONIX) 40 MG tablet  HYPERTENSION, BORDERLINE  Cardiac pacemaker in situ  Atrioventricular block, complete      Plan:     Ekg showed the pacer functioning. Discussed with the daughter the EKG. She prefers not  To pursue cardiac evaluation because his quality of life will not improve. His  Symptoms is  Suspicious for aspirin induced gastritis and will  treat. Hold aspirin this  Week. Treat with Protonix.  Call in 4 weeks on response.  RTC 3 months  Bowie Delia P. Modesto Charon, M.D.

## 2013-02-28 ENCOUNTER — Telehealth: Payer: Self-pay | Admitting: Family Medicine

## 2013-03-01 NOTE — Telephone Encounter (Signed)
Darel Hong aware to stop the protonix and try mylanta

## 2013-03-01 NOTE — Telephone Encounter (Signed)
Stop it. It will make it worse. Give him mylanta prn heart burn. FW

## 2013-04-17 ENCOUNTER — Ambulatory Visit (INDEPENDENT_AMBULATORY_CARE_PROVIDER_SITE_OTHER): Payer: Medicare Other | Admitting: Nurse Practitioner

## 2013-04-17 ENCOUNTER — Ambulatory Visit (INDEPENDENT_AMBULATORY_CARE_PROVIDER_SITE_OTHER): Payer: Medicare Other

## 2013-04-17 ENCOUNTER — Encounter: Payer: Self-pay | Admitting: Nurse Practitioner

## 2013-04-17 VITALS — BP 135/75 | HR 78 | Temp 96.5°F | Ht 69.0 in | Wt 146.4 lb

## 2013-04-17 DIAGNOSIS — K529 Noninfective gastroenteritis and colitis, unspecified: Secondary | ICD-10-CM

## 2013-04-17 DIAGNOSIS — R197 Diarrhea, unspecified: Secondary | ICD-10-CM

## 2013-04-17 NOTE — Patient Instructions (Signed)
Diarrhea Diarrhea is frequent loose and watery bowel movements. It can cause you to feel weak and dehydrated. Dehydration can cause you to become tired and thirsty, have a dry mouth, and have decreased urination that often is dark yellow. Diarrhea is a sign of another problem, most often an infection that will not last long. In most cases, diarrhea typically lasts 2 3 days. However, it can last longer if it is a sign of something more serious. It is important to treat your diarrhea as directed by your caregive to lessen or prevent future episodes of diarrhea. CAUSES  Some common causes include:  Gastrointestinal infections caused by viruses, bacteria, or parasites.  Food poisoning or food allergies.  Certain medicines, such as antibiotics, chemotherapy, and laxatives.  Artificial sweeteners and fructose.  Digestive disorders. HOME CARE INSTRUCTIONS  Ensure adequate fluid intake (hydration): have 1 cup (8 oz) of fluid for each diarrhea episode. Avoid fluids that contain simple sugars or sports drinks, fruit juices, whole milk products, and sodas. Your urine should be clear or pale yellow if you are drinking enough fluids. Hydrate with an oral rehydration solution that you can purchase at pharmacies, retail stores, and online. You can prepare an oral rehydration solution at home by mixing the following ingredients together:    tsp table salt.   tsp baking soda.   tsp salt substitute containing potassium chloride.  1  tablespoons sugar.  1 L (34 oz) of water.  Certain foods and beverages may increase the speed at which food moves through the gastrointestinal (GI) tract. These foods and beverages should be avoided and include:  Caffeinated and alcoholic beverages.  High-fiber foods, such as raw fruits and vegetables, nuts, seeds, and whole grain breads and cereals.  Foods and beverages sweetened with sugar alcohols, such as xylitol, sorbitol, and mannitol.  Some foods may be well  tolerated and may help thicken stool including:  Starchy foods, such as rice, toast, pasta, low-sugar cereal, oatmeal, grits, baked potatoes, crackers, and bagels.  Bananas.  Applesauce.  Add probiotic-rich foods to help increase healthy bacteria in the GI tract, such as yogurt and fermented milk products.  Wash your hands well after each diarrhea episode.  Only take over-the-counter or prescription medicines as directed by your caregiver.  Take a warm bath to relieve any burning or pain from frequent diarrhea episodes. SEEK IMMEDIATE MEDICAL CARE IF:   You are unable to keep fluids down.  You have persistent vomiting.  You have blood in your stool, or your stools are black and tarry.  You do not urinate in 6 8 hours, or there is only a small amount of very dark urine.  You have abdominal pain that increases or localizes.  You have weakness, dizziness, confusion, or lightheadedness.  You have a severe headache.  Your diarrhea gets worse or does not get better.  You have a fever or persistent symptoms for more than 2 3 days.  You have a fever and your symptoms suddenly get worse. MAKE SURE YOU:   Understand these instructions.  Will watch your condition.  Will get help right away if you are not doing well or get worse. Document Released: 09/30/2002 Document Revised: 09/26/2012 Document Reviewed: 06/17/2012 [employer] Patient Information 2014 [employer].  

## 2013-04-17 NOTE — Progress Notes (Signed)
  Subjective:    Patient ID: Warren Ritter, male    DOB: August 18, 1926, 79 y.o.   MRN: [id number]  HPI Patient brought in by daughter for diarrhea- has had for several years- Have Dr. Modesto Charon about this and was just told to take imodium AD daily- NO change- goes 4-12 x a day and it is pure liquid    Review of Systems     Objective:   Physical Exam  Constitutional: He appears well-developed and well-nourished.  Cardiovascular: Normal rate and normal heart sounds.   Pulmonary/Chest: Effort normal and breath sounds normal.  Abdominal: Soft.  Dull to percussion diffusely   KUB- Moderate amount of stool and gas in colon-Preliminary reading by Paulene Floor, FNP  [facility]        Assessment & Plan:  Diarrhea  Want to refer to GI for evaluation  Force fluids  Okay to do imodium at times when really bad  Mary-Margaret Daphine Deutscher, FNP

## 2013-04-19 ENCOUNTER — Encounter: Payer: Self-pay | Admitting: Gastroenterology

## 2013-04-29 ENCOUNTER — Ambulatory Visit (INDEPENDENT_AMBULATORY_CARE_PROVIDER_SITE_OTHER): Payer: Medicare Other | Admitting: *Deleted

## 2013-04-29 DIAGNOSIS — Z950 Presence of cardiac pacemaker: Secondary | ICD-10-CM

## 2013-04-29 DIAGNOSIS — I442 Atrioventricular block, complete: Secondary | ICD-10-CM

## 2013-04-30 LAB — REMOTE PACEMAKER DEVICE
AL IMPEDENCE PM: 450 Ohm
AL THRESHOLD: 0.75 V
BATTERY VOLTAGE: 2.74 V
RV LEAD IMPEDENCE PM: 475 Ohm
RV LEAD THRESHOLD: 0.5 V
VENTRICULAR PACING PM: 100

## 2013-05-03 ENCOUNTER — Encounter: Payer: Self-pay | Admitting: *Deleted

## 2013-05-06 ENCOUNTER — Encounter: Payer: Self-pay | Admitting: Gastroenterology

## 2013-05-06 ENCOUNTER — Other Ambulatory Visit (INDEPENDENT_AMBULATORY_CARE_PROVIDER_SITE_OTHER): Payer: Medicare Other

## 2013-05-06 ENCOUNTER — Ambulatory Visit (INDEPENDENT_AMBULATORY_CARE_PROVIDER_SITE_OTHER): Payer: Medicare Other | Admitting: Gastroenterology

## 2013-05-06 VITALS — BP 120/62 | HR 72 | Ht 69.0 in | Wt 142.2 lb

## 2013-05-06 DIAGNOSIS — R159 Full incontinence of feces: Secondary | ICD-10-CM

## 2013-05-06 DIAGNOSIS — R197 Diarrhea, unspecified: Secondary | ICD-10-CM

## 2013-05-06 DIAGNOSIS — R1084 Generalized abdominal pain: Secondary | ICD-10-CM

## 2013-05-06 DIAGNOSIS — R634 Abnormal weight loss: Secondary | ICD-10-CM

## 2013-05-06 LAB — HEPATIC FUNCTION PANEL
ALT: 12 U/L (ref 0–53)
Alkaline Phosphatase: 73 U/L (ref 39–117)
Bilirubin, Direct: 0.1 mg/dL (ref 0.0–0.3)
Total Bilirubin: 0.4 mg/dL (ref 0.3–1.2)
Total Protein: 6.7 g/dL (ref 6.0–8.3)

## 2013-05-06 LAB — CBC WITH DIFFERENTIAL/PLATELET
Basophils Absolute: 0 10*3/uL (ref 0.0–0.1)
Basophils Relative: 0.5 % (ref 0.0–3.0)
Eosinophils Absolute: 0.1 10*3/uL (ref 0.0–0.7)
Hemoglobin: 13.8 g/dL (ref 13.0–17.0)
Lymphocytes Relative: 14.6 % (ref 12.0–46.0)
MCHC: 33.7 g/dL (ref 30.0–36.0)
MCV: 98.1 fl (ref 78.0–100.0)
Monocytes Absolute: 0.8 10*3/uL (ref 0.1–1.0)
Neutro Abs: 5.2 10*3/uL (ref 1.4–7.7)
Neutrophils Relative %: 73.1 % (ref 43.0–77.0)
RBC: 4.18 Mil/uL — ABNORMAL LOW (ref 4.22–5.81)
RDW: 14.5 % (ref 11.5–14.6)

## 2013-05-06 LAB — BASIC METABOLIC PANEL
CO2: 21 mEq/L (ref 19–32)
Chloride: 107 mEq/L (ref 96–112)
Sodium: 136 mEq/L (ref 135–145)

## 2013-05-06 MED ORDER — CIPROFLOXACIN HCL 500 MG PO TABS: 500.0000 mg | ORAL_TABLET | Freq: Two times a day (BID) | ORAL | Status: DC

## 2013-05-06 MED ORDER — METRONIDAZOLE 500 MG PO TABS: 500.0000 mg | ORAL_TABLET | Freq: Two times a day (BID) | ORAL | Status: DC

## 2013-05-06 NOTE — Patient Instructions (Addendum)
Your physician has requested that you go to the basement for lab work before leaving today.  We have sent the following medications to your pharmacy for you to pick up at your convenience: Cipro and Flagyl.   You have been scheduled for a CT scan of the abdomen and pelvis at [facility] Radiology department.   You are scheduled on 05/08/13 at 4:45pm. You should arrive 15 minutes prior to your appointment time for registration. Please follow the written instructions below on the day of your exam:  WARNING: IF YOU ARE ALLERGIC TO IODINE/X-RAY DYE, PLEASE NOTIFY RADIOLOGY IMMEDIATELY AT [phone]! YOU WILL BE GIVEN A 13 HOUR PREMEDICATION PREP.  1) Do not eat or drink anything after 12:45pm (4 hours prior to your test) 2) You have been given 2 bottles of oral contrast to drink. The solution may taste better if refrigerated, but do NOT add ice or any other liquid to this solution. Shake well before drinking.    Drink 1 bottle of contrast @ 2:45pm (2 hours prior to your exam)  Drink 1 bottle of contrast @ 3:45pm (1 hour prior to your exam)  You may take any medications as prescribed with a small amount of water except for the following: Metformin, Glucophage, Glucovance, Avandamet, Riomet, Fortamet, Actoplus Met, Janumet, Glumetza or Metaglip. The above medications must be held the day of the exam AND 48 hours after the exam.  The purpose of you drinking the oral contrast is to aid in the visualization of your intestinal tract. The contrast solution may cause some diarrhea. Before your exam is started, you will be given a small amount of fluid to drink. Depending on your individual set of symptoms, you may also receive an intravenous injection of x-ray contrast/dye. Plan on being at [facility] for 30 minutes or long, depending on the type of exam you are having performed.  This test typically takes 30-45 minutes to  complete. ________________________________________________________________________   Thank you for choosing me and  Gastroenterology.  Venita Lick. Pleas Koch., MD., Clementeen Graham  cc: Leodis Sias, MD

## 2013-05-06 NOTE — Progress Notes (Signed)
History of Present Illness: This is an 79 year old male accompanied by his daughter. She provides almost all of the history as he has substantial dementia. He has chronic diarrhea and intermittent fecal incontinence. Symptoms present for more than 2 years and are worsening slowly over time. Had stool studies 2 years ago that were apparently negative. He complains of generalized abdominal pain that is poorly described. It does not appear to worsen with meals or bowel movements. His appetite is very good. He has lost 13 pounds in the past several months. Denies constipation, change in stool caliber, melena, hematochezia, nausea, vomiting, dysphagia, reflux symptoms, chest pain.  Review of Systems: Pertinent positive and negative review of systems were noted in the above HPI section. All other review of systems were otherwise negative.  Current Medications, Allergies, Past Medical History, Past Surgical History, Family History and Social History were reviewed in [employer] record.  Physical Exam: General: Well developed, elderly, frail, no acute distress Head: Normocephalic and atraumatic Eyes:  sclerae anicteric, EOMI Ears: Normal auditory acuity Mouth: No deformity or lesions Neck: Supple, no masses or thyromegaly Lungs: Clear throughout to auscultation Heart: Regular rate and rhythm; no murmurs, rubs or bruits Abdomen: Soft, non tender and non distended. No masses, hepatosplenomegaly or hernias noted. Normal Bowel sounds Rectal: mild anal canal stenosis, slightly decreased sphincter tone with some ability to perform a voluntary squeeze, heme neg loose stool Musculoskeletal: Symmetrical with no gross deformities  Skin: No lesions on visible extremities Pulses:  Normal pulses noted Extremities: No clubbing, cyanosis, edema or deformities noted Neurological: Alert oriented x 1, significant memory and congnitive impariments Cervical Nodes:  No significant cervical  adenopathy Inguinal Nodes: No significant inguinal adenopathy Psychological:  Alert and cooperative. Normal mood and affect  Assessment and Recommendations:  1. Diarrhea, abdominal pain, weight loss, fecal incontinence, dementia. Rule out IBD, microscopic colitis, neoplasms, celiac disease, infectious causes. Dementia may be causing incontinence in combination with diarrhea. I advised obtaining stool studies however the daughter does not think she can get him to cooperate with stool smaple collections. Advised colonoscopy and upper endoscopy and again the daughter feels he will not be able to cooperate with a bowel prep and procedures. Obtain blood work today including a celiac panel and schedule abdominal/pelvic CT. Empiric trial of Cipro and Flagyl for a possible infectious etiology. Continue Imodium when necessary.

## 2013-05-07 LAB — CELIAC PANEL 10
Endomysial Screen: NEGATIVE
Gliadin IgA: 3 U/mL (ref <20)
Tissue Transglut Ab: 8.8 U/mL (ref <20)

## 2013-05-08 ENCOUNTER — Encounter (HOSPITAL_COMMUNITY): Payer: Self-pay

## 2013-05-08 ENCOUNTER — Ambulatory Visit (HOSPITAL_COMMUNITY)
Admission: RE | Admit: 2013-05-08 | Discharge: 2013-05-08 | Disposition: A | Discharge status: Home / Self Care | Payer: Medicare Other | Source: Ambulatory Visit | Attending: Gastroenterology | Admitting: Gastroenterology

## 2013-05-08 DIAGNOSIS — R197 Diarrhea, unspecified: Secondary | ICD-10-CM | POA: Insufficient documentation

## 2013-05-08 DIAGNOSIS — I319 Disease of pericardium, unspecified: Secondary | ICD-10-CM | POA: Insufficient documentation

## 2013-05-08 DIAGNOSIS — R159 Full incontinence of feces: Secondary | ICD-10-CM

## 2013-05-08 DIAGNOSIS — R634 Abnormal weight loss: Secondary | ICD-10-CM

## 2013-05-08 DIAGNOSIS — R109 Unspecified abdominal pain: Secondary | ICD-10-CM | POA: Insufficient documentation

## 2013-05-08 DIAGNOSIS — R1084 Generalized abdominal pain: Secondary | ICD-10-CM

## 2013-05-08 DIAGNOSIS — K7689 Other specified diseases of liver: Secondary | ICD-10-CM | POA: Insufficient documentation

## 2013-05-08 MED ORDER — IOHEXOL 300 MG/ML  SOLN
100.0000 mL | Freq: Once | INTRAMUSCULAR | Status: AC | PRN
  Administered 2013-05-08: 100 mL via INTRAVENOUS

## 2013-05-10 ENCOUNTER — Encounter: Payer: Self-pay | Admitting: Internal Medicine

## 2013-05-28 ENCOUNTER — Ambulatory Visit (INDEPENDENT_AMBULATORY_CARE_PROVIDER_SITE_OTHER): Payer: Medicare Other | Admitting: Family Medicine

## 2013-05-28 ENCOUNTER — Encounter: Payer: Self-pay | Admitting: Family Medicine

## 2013-05-28 VITALS — BP 122/68 | HR 81 | Temp 98.2°F | Wt 144.4 lb

## 2013-05-28 DIAGNOSIS — K529 Noninfective gastroenteritis and colitis, unspecified: Secondary | ICD-10-CM | POA: Insufficient documentation

## 2013-05-28 DIAGNOSIS — R159 Full incontinence of feces: Secondary | ICD-10-CM

## 2013-05-28 DIAGNOSIS — R197 Diarrhea, unspecified: Secondary | ICD-10-CM

## 2013-05-28 DIAGNOSIS — F0391 Unspecified dementia with behavioral disturbance: Secondary | ICD-10-CM

## 2013-05-28 DIAGNOSIS — F03918 Unspecified dementia, unspecified severity, with other behavioral disturbance: Secondary | ICD-10-CM | POA: Insufficient documentation

## 2013-05-28 DIAGNOSIS — Z950 Presence of cardiac pacemaker: Secondary | ICD-10-CM

## 2013-05-28 DIAGNOSIS — F429 Obsessive-compulsive disorder, unspecified: Secondary | ICD-10-CM | POA: Insufficient documentation

## 2013-05-28 DIAGNOSIS — I10 Essential (primary) hypertension: Secondary | ICD-10-CM

## 2013-05-28 DIAGNOSIS — I442 Atrioventricular block, complete: Secondary | ICD-10-CM | POA: Insufficient documentation

## 2013-05-28 MED ORDER — LORAZEPAM 0.5 MG PO TABS: 0.5000 mg | ORAL_TABLET | Freq: Three times a day (TID) | ORAL | Status: DC

## 2013-05-28 NOTE — Progress Notes (Signed)
Patient ID: Warren Ritter, male   DOB: 1926/01/20, 79 y.o.   MRN: [id number] SUBJECTIVE: CC: Chief Complaint  Patient presents with  . Follow-up    saw Dr Russella Dar ,loose stools, combative at times agitated  cant be still never content     HPI: Patient is here for follow up of hypertension: denies Headache;deniesChest Pain;denies weakness;denies Shortness of Breath or Orthopnea;denies Visual changes;denies palpitations;denies cough;denies pedal edema;denies symptoms of TIA or stroke; admits to Compliance with medications. denies Problems with medications.  Demetia progressing. Had abdominal problems and  Diarrhea and work up was negative. 5 stools a day.controlled a bit with imodium. Everything else hasn't worked.    Past Medical History  Diagnosis Date  . Dementia   . Stool incontinence   . Hypertension   . OCD (obsessive compulsive disorder)   . Cardiac pacemaker   . Heart block AV third degree   . MRSA (methicillin resistant staph aureus) culture positive    Past Surgical History  Procedure Laterality Date  . Hernia repair      inguinal  . Implantation of a medtronic dual-chamber pacemaker  10/05/2007    Dr. Lewayne Bunting   History   Social History  . Marital Status: Widowed    Spouse Name: N/A    Number of Children: 1  . Years of Education: N/A   Occupational History  . Retired    Social History Main Topics  . Smoking status: Never Smoker   . Smokeless tobacco: Never Used  . Alcohol Use: No  . Drug Use: No  . Sexually Active: Not on file   Other Topics Concern  . Not on file   Social History Narrative  . No narrative on file   Family History  Problem Relation Age of Onset  . Diabetes Mother   . Kidney disease Mother   . Prostate cancer Father   . Prostate cancer Brother   . Diabetes Brother   . Heart disease Brother   . Diabetes Sister    Current Outpatient Prescriptions on File Prior to Visit  Medication Sig Dispense Refill  . amLODipine (NORVASC) 5  MG tablet TAKE ONE TABLET BY MOUTH ONE TIME DAILY  90 tablet  3  . aspirin 81 MG tablet Take 81 mg by mouth daily.        . Loperamide HCl (IMODIUM PO) Take by mouth as needed.        Marland Kitchen LORazepam (ATIVAN) 0.5 MG tablet Take 0.5 mg by mouth every 8 (eight) hours.       No current facility-administered medications on file prior to visit.   No Known Allergies  There is no immunization history on file for this patient. Prior to Admission medications   Medication Sig Start Date End Date Taking? Authorizing Provider  amLODipine (NORVASC) 5 MG tablet TAKE ONE TABLET BY MOUTH ONE TIME DAILY 11/21/12  Yes Marinus Maw, MD  aspirin 81 MG tablet Take 81 mg by mouth daily.     Yes Historical Provider, MD  Loperamide HCl (IMODIUM PO) Take by mouth as needed.     Yes Historical Provider, MD  LORazepam (ATIVAN) 0.5 MG tablet Take 0.5 mg by mouth every 8 (eight) hours.   Yes Historical Provider, MD     ROS: As above in the HPI. All other systems are stable or negative.  OBJECTIVE: APPEARANCE:  Patient in no acute distress.The patient appeared well nourished and normally developed. Acyanotic. Waist: VITAL SIGNS:BP 122/68  Pulse 81  Temp(Src)  98.2 F (36.8 C) (Oral)  Wt 144 lb 6.4 oz (65.499 kg)  BMI 21.31 kg/m2  WM  SKIN: warm and  Dry without overt rashes, tattoos and scars  HEAD and Neck: without JVD, Head and scalp: normal Eyes:No scleral icterus. Fundi normal, eye movements normal. Ears: Auricle normal, canal normal, Tympanic membranes normal, insufflation normal. Nose: normal Throat: normal Neck & thyroid: normal  CHEST & LUNGS: Chest wall: normal Lungs: Clear  CVS: Reveals the PMI to be normally located. Regular rhythm, First and Second Heart sounds are normal,  absence of murmurs, rubs or gallops. Peripheral vasculature: Radial pulses: normal Dorsal pedis pulses: normal Posterior pulses: normal  ABDOMEN:  Appearance: normal Benign, no organomegaly, no masses, no  Abdominal Aortic enlargement. No Guarding , no rebound. No Bruits. Bowel sounds: normal  RECTAL: N/A GU: N/A  EXTREMETIES: nonedematous. Both Femoral and Pedal pulses are normal.  MUSCULOSKELETAL:  Spine: normal Joints: intact  NEUROLOGIC: oriented to time,place and person; nonfocal. Strength is normal Sensory is normal Reflexes are normal Cranial Nerves are normal.  ASSESSMENT: HYPERTENSION, BORDERLINE  Dementia with behavioral disturbance - Plan: LORazepam (ATIVAN) 0.5 MG tablet  OCD (obsessive compulsive disorder)  Chronic diarrhea  Heart block AV third degree  Stool incontinence  Cardiac pacemaker in situ    PLAN: Not much else to offer. Reviewed the GI work up.  Meds ordered this encounter  Medications  . LORazepam (ATIVAN) 0.5 MG tablet    Sig: Take 1 tablet (0.5 mg total) by mouth every 8 (eight) hours.    Dispense:  60 tablet    Refill:  2    Results for orders placed in visit on 05/06/13  BASIC METABOLIC PANEL      Result Value Range   Sodium 136  135 - 145 mEq/L   Potassium 3.6  3.5 - 5.1 mEq/L   Chloride 107  96 - 112 mEq/L   CO2 21  19 - 32 mEq/L   Glucose, Bld 106 (*) 70 - 99 mg/dL   BUN 16  6 - 23 mg/dL   Creatinine, Ser 0.8  0.4 - 1.5 mg/dL   Calcium 8.9  8.4 - 16.1 mg/dL   GFR 096.04  >54.09 mL/min  HEPATIC FUNCTION PANEL      Result Value Range   Total Bilirubin 0.4  0.3 - 1.2 mg/dL   Bilirubin, Direct 0.1  0.0 - 0.3 mg/dL   Alkaline Phosphatase 73  39 - 117 U/L   AST 18  0 - 37 U/L   ALT 12  0 - 53 U/L   Total Protein 6.7  6.0 - 8.3 g/dL   Albumin 4.0  3.5 - 5.2 g/dL  CBC WITH DIFFERENTIAL      Result Value Range   WBC 7.1  4.5 - 10.5 K/uL   RBC 4.18 (*) 4.22 - 5.81 Mil/uL   Hemoglobin 13.8  13.0 - 17.0 g/dL   HCT 81.1  91.4 - 78.2 %   MCV 98.1  78.0 - 100.0 fl   MCHC 33.7  30.0 - 36.0 g/dL   RDW 95.6  21.3 - 08.6 %   Platelets 168.0  150.0 - 400.0 K/uL   Neutrophils Relative % 73.1  43.0 - 77.0 %   Lymphocytes Relative  14.6  12.0 - 46.0 %   Monocytes Relative 10.7  3.0 - 12.0 %   Eosinophils Relative 1.1  0.0 - 5.0 %   Basophils Relative 0.5  0.0 - 3.0 %   Neutro Abs 5.2  1.4 - 7.7 K/uL   Lymphs Abs 1.0  0.7 - 4.0 K/uL   Monocytes Absolute 0.8  0.1 - 1.0 K/uL   Eosinophils Absolute 0.1  0.0 - 0.7 K/uL   Basophils Absolute 0.0  0.0 - 0.1 K/uL  TSH      Result Value Range   TSH 3.26  0.35 - 5.50 uIU/mL  CELIAC PANEL 10      Result Value Range   IgA 121  68 - 379 mg/dL   Endomysial Screen NEGATIVE  NEGATIVE   Tissue Transglutaminase Ab, IgA 3.3  <20 U/mL   Gliadin IgG 7.7  <20 U/mL   Gliadin IgA 3.0  <20 U/mL   Tissue Transglut Ab 8.8  <20 U/mL   Return in about 3 months (around 08/28/2013) for Recheck medical problems.  Warren Ritter, M.D.

## 2013-07-09 ENCOUNTER — Encounter: Payer: Self-pay | Admitting: Internal Medicine

## 2013-07-09 ENCOUNTER — Ambulatory Visit (INDEPENDENT_AMBULATORY_CARE_PROVIDER_SITE_OTHER): Payer: Medicare Other | Admitting: Internal Medicine

## 2013-07-09 VITALS — BP 117/64 | HR 76 | Ht 69.0 in | Wt 142.0 lb

## 2013-07-09 DIAGNOSIS — I10 Essential (primary) hypertension: Secondary | ICD-10-CM

## 2013-07-09 DIAGNOSIS — I442 Atrioventricular block, complete: Secondary | ICD-10-CM

## 2013-07-09 DIAGNOSIS — F0391 Unspecified dementia with behavioral disturbance: Secondary | ICD-10-CM

## 2013-07-09 DIAGNOSIS — Z950 Presence of cardiac pacemaker: Secondary | ICD-10-CM

## 2013-07-09 LAB — PACEMAKER DEVICE OBSERVATION
AL IMPEDENCE PM: 453 Ohm
ATRIAL PACING PM: 80
BATTERY VOLTAGE: 2.73 V

## 2013-07-09 NOTE — Assessment & Plan Note (Signed)
His Medtronic dual-chamber pacemaker is working normally. We'll plan to recheck in several months. He is maintaining sinus rhythm.

## 2013-07-09 NOTE — Assessment & Plan Note (Signed)
He has evidence of mild dementia, and his daughter states that at times it gets worse with sundowning.

## 2013-07-09 NOTE — Assessment & Plan Note (Signed)
His blood pressure is well controlled. He will continue his current medical therapy. 

## 2013-07-09 NOTE — Patient Instructions (Addendum)
Your physician recommends that you schedule a follow-up appointment in: 1 year with Dr Ladona Ridgel and [facility] on 10/14/13

## 2013-07-09 NOTE — Progress Notes (Signed)
HPI Warren Ritter returns today for followup. He is a very pleasant 79 year old man with a history of symptomatic bradycardia, status post permanent pacemaker insertion. He has chronic diarrhea, and is lost over 15 pounds in the last year. He has undergone GI evaluation, apparently unremarkable. He denies chest pain, shortness of breath, or syncope. He has between 4 and 10 bowel movements a day. At times he has  Incontinence. No Known Allergies   Current Outpatient Prescriptions  Medication Sig Dispense Refill  . amLODipine (NORVASC) 5 MG tablet TAKE ONE TABLET BY MOUTH ONE TIME DAILY  90 tablet  3  . aspirin 81 MG tablet Take 81 mg by mouth daily.        . DimenhyDRINATE (DRAMAMINE PO) Take by mouth.      . Loperamide HCl (IMODIUM PO) Take by mouth as needed.        Marland Kitchen LORazepam (ATIVAN) 0.5 MG tablet Take 1 tablet (0.5 mg total) by mouth every 8 (eight) hours.  60 tablet  2   No current facility-administered medications for this visit.     Past Medical History  Diagnosis Date  . Dementia   . Stool incontinence   . Hypertension   . OCD (obsessive compulsive disorder)   . Cardiac pacemaker   . Heart block AV third degree   . MRSA (methicillin resistant staph aureus) culture positive     ROS:   All systems reviewed and negative except as noted in the HPI.   Past Surgical History  Procedure Laterality Date  . Hernia repair      inguinal  . Implantation of a medtronic dual-chamber pacemaker  10/05/2007    Dr. Lewayne Ritter     Family History  Problem Relation Age of Onset  . Diabetes Mother   . Kidney disease Mother   . Prostate cancer Father   . Prostate cancer Brother   . Diabetes Brother   . Heart disease Brother   . Diabetes Sister      History   Social History  . Marital Status: Widowed    Spouse Name: N/A    Number of Children: 1  . Years of Education: N/A   Occupational History  . Retired    Social History Main Topics  . Smoking status: Never Smoker    . Smokeless tobacco: Never Used  . Alcohol Use: No  . Drug Use: No  . Sexual Activity: Not on file   Other Topics Concern  . Not on file   Social History Narrative  . No narrative on file     BP 117/64  Pulse 76  Ht 5\' 9"  (1.753 m)  Wt 142 lb (64.411 kg)  BMI 20.96 kg/m2  Physical Exam:  Well appearing elderly man,NAD HEENT: Unremarkable Neck:  6 cm JVD, no thyromegally Back:  No CVA tenderness Lungs:  Clear with no wheezes, rales, or rhonchi. HEART:  Regular rate rhythm, no murmurs, no rubs, no clicks Abd:  soft, positive bowel sounds, no organomegally, no rebound, no guarding Ext:  2 plus pulses, no edema, no cyanosis, no clubbing Skin:  No rashes no nodules Neuro:  CN II through XII intact, motor grossly intact  DEVICE  Normal device function.  See PaceArt for details.   Assess/Plan:

## 2013-08-29 ENCOUNTER — Ambulatory Visit (INDEPENDENT_AMBULATORY_CARE_PROVIDER_SITE_OTHER): Payer: Medicare Other | Admitting: Family Medicine

## 2013-08-29 ENCOUNTER — Encounter: Payer: Self-pay | Admitting: Family Medicine

## 2013-08-29 VITALS — BP 121/72 | HR 84 | Temp 97.7°F | Ht 69.0 in | Wt 143.2 lb

## 2013-08-29 DIAGNOSIS — IMO0001 Reserved for inherently not codable concepts without codable children: Secondary | ICD-10-CM | POA: Insufficient documentation

## 2013-08-29 DIAGNOSIS — F03918 Unspecified dementia, unspecified severity, with other behavioral disturbance: Secondary | ICD-10-CM

## 2013-08-29 DIAGNOSIS — I442 Atrioventricular block, complete: Secondary | ICD-10-CM

## 2013-08-29 DIAGNOSIS — R159 Full incontinence of feces: Secondary | ICD-10-CM

## 2013-08-29 DIAGNOSIS — Z950 Presence of cardiac pacemaker: Secondary | ICD-10-CM

## 2013-08-29 DIAGNOSIS — F0391 Unspecified dementia with behavioral disturbance: Secondary | ICD-10-CM

## 2013-08-29 DIAGNOSIS — I10 Essential (primary) hypertension: Secondary | ICD-10-CM

## 2013-08-29 DIAGNOSIS — R350 Frequency of micturition: Secondary | ICD-10-CM

## 2013-08-29 LAB — POCT UA - MICROSCOPIC ONLY
Casts, Ur, LPF, POC: NEGATIVE
Crystals, Ur, HPF, POC: NEGATIVE
Epithelial cells, urine per micros: NEGATIVE
RBC, urine, microscopic: NEGATIVE
WBC, Ur, HPF, POC: NEGATIVE

## 2013-08-29 LAB — POCT URINALYSIS DIPSTICK
Bilirubin, UA: NEGATIVE
Glucose, UA: NEGATIVE
Ketones, UA: NEGATIVE
Leukocytes, UA: NEGATIVE
Nitrite, UA: NEGATIVE
Protein, UA: NEGATIVE
Spec Grav, UA: 1.02
Urobilinogen, UA: NEGATIVE
pH, UA: 5

## 2013-08-29 NOTE — Progress Notes (Signed)
Patient ID: Warren Ritter, male   DOB: 01/27/1926, 79 y.o.   MRN: [id number] SUBJECTIVE: CC: Chief Complaint  Patient presents with  . Follow-up    HPI:  Patient is here for follow up of hypertension/Dementia/Pacer/complete AV block: denies Headache;deniesChest Pain;denies weakness;denies Shortness of Breath or Orthopnea;denies Visual changes;denies palpitations;denies cough;denies pedal edema;denies symptoms of TIA or stroke; admits to Compliance with medications. denies Problems with medications.  Urine has been frequent and frothy looking. No other symptoms. No fever no pain.   Past Medical History  Diagnosis Date  . Dementia   . Stool incontinence   . Hypertension   . OCD (obsessive compulsive disorder)   . Cardiac pacemaker   . Heart block AV third degree   . MRSA (methicillin resistant staph aureus) culture positive    Past Surgical History  Procedure Laterality Date  . Hernia repair      inguinal  . Implantation of a medtronic dual-chamber pacemaker  10/05/2007    Dr. Lewayne Bunting   History   Social History  . Marital Status: Widowed    Spouse Name: N/A    Number of Children: 1  . Years of Education: N/A   Occupational History  . Retired    Social History Main Topics  . Smoking status: Never Smoker   . Smokeless tobacco: Never Used  . Alcohol Use: No  . Drug Use: No  . Sexual Activity: Not on file   Other Topics Concern  . Not on file   Social History Narrative  . No narrative on file   Family History  Problem Relation Age of Onset  . Diabetes Mother   . Kidney disease Mother   . Prostate cancer Father   . Prostate cancer Brother   . Diabetes Brother   . Heart disease Brother   . Diabetes Sister    Current Outpatient Prescriptions on File Prior to Visit  Medication Sig Dispense Refill  . amLODipine (NORVASC) 5 MG tablet TAKE ONE TABLET BY MOUTH ONE TIME DAILY  90 tablet  3  . aspirin 81 MG tablet Take 81 mg by mouth daily.        .  DimenhyDRINATE (DRAMAMINE PO) Take by mouth.      . Loperamide HCl (IMODIUM PO) Take by mouth as needed.        Marland Kitchen LORazepam (ATIVAN) 0.5 MG tablet Take 1 tablet (0.5 mg total) by mouth every 8 (eight) hours.  60 tablet  2   No current facility-administered medications on file prior to visit.   No Known Allergies Immunization History  Administered Date(s) Administered  . Influenza, High Dose Seasonal PF 08/06/2013   Prior to Admission medications   Medication Sig Start Date End Date Taking? Authorizing Provider  amLODipine (NORVASC) 5 MG tablet TAKE ONE TABLET BY MOUTH ONE TIME DAILY 11/21/12   Marinus Maw, MD  aspirin 81 MG tablet Take 81 mg by mouth daily.      Historical Provider, MD  DimenhyDRINATE (DRAMAMINE PO) Take by mouth.    Historical Provider, MD  Loperamide HCl (IMODIUM PO) Take by mouth as needed.      Historical Provider, MD  LORazepam (ATIVAN) 0.5 MG tablet Take 1 tablet (0.5 mg total) by mouth every 8 (eight) hours. 05/28/13   Ileana Ladd, MD     ROS: As above in the HPI. All other systems are stable or negative.  OBJECTIVE: APPEARANCE:  Patient in no acute distress.The patient appeared well nourished and normally developed.  Acyanotic. Waist: VITAL SIGNS:BP 121/72  Pulse 84  Temp(Src) 97.7 F (36.5 C) (Oral)  Ht 5\' 9"  (1.753 m)  Wt 143 lb 3.2 oz (64.955 kg)  BMI 21.14 kg/m2  WM pleasantly demented.  SKIN: warm and  Dry without overt rashes, tattoos and scars  HEAD and Neck: without JVD, Head and scalp: normal Eyes:No scleral icterus. Fundi normal, eye movements normal. Ears: Auricle normal, canal normal, Tympanic membranes normal, insufflation normal. Nose: normal Throat: normal Neck & thyroid: normal  CHEST & LUNGS: Chest wall: normal Lungs: Clear  CVS: Reveals the PMI to be normally located. Regular rhythm, First and Second Heart sounds are normal,  absence of murmurs, rubs or gallops. Peripheral vasculature: Radial pulses: normal Dorsal  pedis pulses: normal Posterior pulses: normal  ABDOMEN:  Appearance: normal Benign, no organomegaly, no masses, no Abdominal Aortic enlargement. No Guarding , no rebound. No Bruits. Bowel sounds: normal  RECTAL: N/A GU: N/A  EXTREMETIES: nonedematous.  MUSCULOSKELETAL:  Spine: normal Joints: intact  NEUROLOGIC: oriented to person; nonfocal in extremitiesl   ASSESSMENT:  Frequency - Plan: POCT urinalysis dipstick, POCT UA - Microscopic Only  Dementia with behavioral disturbance  HYPERTENSION, BORDERLINE  Atrioventricular block, complete  Cardiac pacemaker in situ  Stool incontinence  PLAN:      Dr Woodroe Mode Recommendations  For nutrition information, I recommend books:  1).Eat to Live by Dr Monico Hoar. 2).Prevent and Reverse Heart Disease by Dr Suzzette Righter. 3) Dr Katherina Right Book:  Program to Reverse Diabetes  [employer] study on Alzheimer's 4oz of fish: salmon, tuna, halibut, cod: 2 to 3 x a week Vegan diet Vit B12 5 mg daily Vit D3 2000Units daily Omega-3 fish oil 2 gm twice a day Vit B complex tablet daily  See if brain function improves in 4 weeks.   Results for orders placed in visit on 08/29/13  POCT URINALYSIS DIPSTICK      Result Value Range   Color, UA yellow     Clarity, UA clear     Glucose, UA neg     Bilirubin, UA neg     Ketones, UA neg     Spec Grav, UA 1.020     Blood, UA trace     pH, UA 5.0     Protein, UA neg     Urobilinogen, UA negative     Nitrite, UA neg     Leukocytes, UA Negative    POCT UA - MICROSCOPIC ONLY      Result Value Range   WBC, Ur, HPF, POC neg     RBC, urine, microscopic neg     Bacteria, U Microscopic occ     Mucus, UA trace     Epithelial cells, urine per micros neg     Crystals, Ur, HPF, POC neg     Casts, Ur, LPF, POC neg      Orders Placed This Encounter  Procedures  . POCT urinalysis dipstick  . POCT UA - Microscopic Only  no other changes in regimen for now  No orders of the  defined types were placed in this encounter.   There are no discontinued medications. Return in about 4 months (around 12/27/2013) for Recheck medical problems.  Damean Poffenberger P. Modesto Charon, M.D.

## 2013-08-29 NOTE — Patient Instructions (Signed)
      Dr Woodroe Mode Recommendations  For nutrition information, I recommend books:  1).Eat to Live by Dr Monico Hoar. 2).Prevent and Reverse Heart Disease by Dr Suzzette Righter. 3) Dr Katherina Right Book:  Program to Reverse Diabetes  [employer] study on Alzheimer's 4oz of fish: salmon, tuna, halibut, cod: 2 to 3 x a week Vegan diet Vit B12 5 mg daily Vit D3 2000Units daily Omega-3 fish oil 2 gm twice a day Vit B complex tablet daily  See if brain function improves in 4 weeks.

## 2013-10-14 ENCOUNTER — Ambulatory Visit (INDEPENDENT_AMBULATORY_CARE_PROVIDER_SITE_OTHER): Payer: Medicare Other | Admitting: *Deleted

## 2013-10-14 DIAGNOSIS — I442 Atrioventricular block, complete: Secondary | ICD-10-CM

## 2013-10-28 LAB — MDC_IDC_ENUM_SESS_TYPE_REMOTE
Battery Impedance: 2008 Ohm
Battery Remaining Longevity: 21 mo
Battery Voltage: 2.71 V
Brady Statistic AP VP Percent: 90 %
Brady Statistic AP VS Percent: 0 %
Brady Statistic AS VP Percent: 9 %
Brady Statistic AS VS Percent: 0 %
Date Time Interrogation Session: 20141222222756
Lead Channel Impedance Value: 449 Ohm
Lead Channel Impedance Value: 452 Ohm
Lead Channel Pacing Threshold Amplitude: 0.5 V
Lead Channel Pacing Threshold Amplitude: 0.625 V
Lead Channel Pacing Threshold Pulse Width: 0.4 ms
Lead Channel Pacing Threshold Pulse Width: 0.4 ms
Lead Channel Setting Pacing Amplitude: 2 V
Lead Channel Setting Pacing Amplitude: 2.5 V
Lead Channel Setting Pacing Pulse Width: 0.4 ms
Lead Channel Setting Sensing Sensitivity: 2 mV

## 2013-11-06 ENCOUNTER — Encounter: Payer: Self-pay | Admitting: *Deleted

## 2013-11-12 ENCOUNTER — Encounter: Payer: Self-pay | Admitting: Internal Medicine

## 2013-11-16 ENCOUNTER — Other Ambulatory Visit: Payer: Self-pay | Admitting: Internal Medicine

## 2013-12-02 ENCOUNTER — Encounter: Payer: Self-pay | Admitting: *Deleted

## 2013-12-23 ENCOUNTER — Encounter: Payer: Self-pay | Admitting: Family Medicine

## 2013-12-23 ENCOUNTER — Ambulatory Visit (INDEPENDENT_AMBULATORY_CARE_PROVIDER_SITE_OTHER): Payer: Medicare Other | Admitting: Family Medicine

## 2013-12-23 ENCOUNTER — Ambulatory Visit (INDEPENDENT_AMBULATORY_CARE_PROVIDER_SITE_OTHER): Payer: Medicare Other

## 2013-12-23 VITALS — BP 122/65 | HR 93 | Temp 96.9°F | Ht 69.0 in | Wt 140.0 lb

## 2013-12-23 DIAGNOSIS — F429 Obsessive-compulsive disorder, unspecified: Secondary | ICD-10-CM

## 2013-12-23 DIAGNOSIS — R159 Full incontinence of feces: Secondary | ICD-10-CM

## 2013-12-23 DIAGNOSIS — I10 Essential (primary) hypertension: Secondary | ICD-10-CM

## 2013-12-23 DIAGNOSIS — F0391 Unspecified dementia with behavioral disturbance: Secondary | ICD-10-CM

## 2013-12-23 DIAGNOSIS — F03918 Unspecified dementia, unspecified severity, with other behavioral disturbance: Secondary | ICD-10-CM

## 2013-12-23 DIAGNOSIS — I442 Atrioventricular block, complete: Secondary | ICD-10-CM

## 2013-12-23 DIAGNOSIS — M549 Dorsalgia, unspecified: Secondary | ICD-10-CM

## 2013-12-23 DIAGNOSIS — M4850XA Collapsed vertebra, not elsewhere classified, site unspecified, initial encounter for fracture: Secondary | ICD-10-CM | POA: Insufficient documentation

## 2013-12-23 DIAGNOSIS — M419 Scoliosis, unspecified: Secondary | ICD-10-CM | POA: Insufficient documentation

## 2013-12-23 DIAGNOSIS — Z950 Presence of cardiac pacemaker: Secondary | ICD-10-CM

## 2013-12-23 DIAGNOSIS — M4120 Other idiopathic scoliosis, site unspecified: Secondary | ICD-10-CM

## 2013-12-23 LAB — POCT CBC
Granulocyte percent: 78.8 %G (ref 37–80)
HCT, POC: 41.1 % — AB (ref 43.5–53.7)
Hemoglobin: 13 g/dL — AB (ref 14.1–18.1)
Lymph, poc: 1.1 (ref 0.6–3.4)
MCH, POC: 31.1 pg (ref 27–31.2)
MCHC: 31.8 g/dL (ref 31.8–35.4)
MCV: 97.9 fL — AB (ref 80–97)
MPV: 8.1 fL (ref 0–99.8)
POC Granulocyte: 5.4 (ref 2–6.9)
POC LYMPH PERCENT: 16.2 %L (ref 10–50)
Platelet Count, POC: 188 10*3/uL (ref 142–424)
RBC: 4.2 M/uL — AB (ref 4.69–6.13)
RDW, POC: 14 %
WBC: 6.9 10*3/uL (ref 4.6–10.2)

## 2013-12-23 MED ORDER — TRAMADOL HCL 50 MG PO TABS: 50.0000 mg | ORAL_TABLET | Freq: Two times a day (BID) | ORAL | Status: DC | PRN

## 2013-12-23 MED ORDER — LORAZEPAM 0.5 MG PO TABS: 0.5000 mg | ORAL_TABLET | Freq: Three times a day (TID) | ORAL | Status: AC

## 2013-12-23 NOTE — Progress Notes (Signed)
Patient ID: Warren Ritter, male   DOB: 1926/06/08, 80 y.o.   MRN: [id number] SUBJECTIVE: CC: Chief Complaint  Patient presents with  . Hypertension    daughter requesting lab work  . Dementia  . Back Pain    HPI: Patient is here for follow up of hypertension: denies Headache;deniesChest Pain;denies weakness;denies Shortness of Breath or Orthopnea;denies Visual changes;denies palpitations;denies cough;denies pedal edema;denies symptoms of TIA or stroke; admits to Compliance with medications. denies Problems with medications.   Follow up of dementia. He is becoming a bit more difficult to take care of , but the daughter decided to keep on taking care of her Dad because of economic limitations.   Has chronic back ache on a daily basis. He is more bent over and he has scoliosis.  Past Medical History  Diagnosis Date  . Dementia   . Stool incontinence   . Hypertension   . OCD (obsessive compulsive disorder)   . Cardiac pacemaker   . Heart block AV third degree   . MRSA (methicillin resistant staph aureus) culture positive    Past Surgical History  Procedure Laterality Date  . Hernia repair      inguinal  . Implantation of a medtronic dual-chamber pacemaker  10/05/2007    Dr. Cristopher Peru   History   Social History  . Marital Status: Widowed    Spouse Name: N/A    Number of Children: 1  . Years of Education: N/A   Occupational History  . Retired    Social History Main Topics  . Smoking status: Never Smoker   . Smokeless tobacco: Never Used  . Alcohol Use: No  . Drug Use: No  . Sexual Activity: Not on file   Other Topics Concern  . Not on file   Social History Narrative  . No narrative on file   Family History  Problem Relation Age of Onset  . Diabetes Mother   . Kidney disease Mother   . Prostate cancer Father   . Prostate cancer Brother   . Diabetes Brother   . Heart disease Brother   . Diabetes Sister    Current Outpatient Prescriptions on File Prior  to Visit  Medication Sig Dispense Refill  . amLODipine (NORVASC) 5 MG tablet TAKE ONE TABLET BY MOUTH ONE TIME DAILY  90 tablet  2  . DimenhyDRINATE (DRAMAMINE PO) Take by mouth.      . Loperamide HCl (IMODIUM PO) Take by mouth as needed.         No current facility-administered medications on file prior to visit.   No Known Allergies Immunization History  Administered Date(s) Administered  . Influenza, High Dose Seasonal PF 08/06/2013  . Tdap 10/17/2004   Prior to Admission medications   Medication Sig Start Date End Date Taking? Authorizing Provider  amLODipine (NORVASC) 5 MG tablet TAKE ONE TABLET BY MOUTH ONE TIME DAILY 11/16/13  Yes Evans Lance, MD  aspirin 81 MG tablet Take 81 mg by mouth daily.     Yes Historical Provider, MD  DimenhyDRINATE (DRAMAMINE PO) Take by mouth.   Yes Historical Provider, MD  Loperamide HCl (IMODIUM PO) Take by mouth as needed.     Yes Historical Provider, MD  LORazepam (ATIVAN) 0.5 MG tablet Take 1 tablet (0.5 mg total) by mouth every 8 (eight) hours. 05/28/13  Yes Vernie Shanks, MD     ROS: As above in the HPI. All other systems are stable or negative.  OBJECTIVE: APPEARANCE:  Patient in  no acute distress.The patient appeared well nourished and normally developed. Acyanotic. Waist: VITAL SIGNS:BP 122/65  Pulse 93  Temp(Src) 96.9 F (36.1 C) (Oral)  Ht 5' 9"  (1.753 m)  Wt 140 lb (63.504 kg)  BMI 20.67 kg/m2  WM pleasant quiet and  Demented  SKIN: warm and  Dry without overt rashes, tattoos and scars  HEAD and Neck: without JVD, Head and scalp: normal Eyes:No scleral icterus. Fundi normal, eye movements normal. Ears: Auricle normal, canal normal, Tympanic membranes normal, insufflation normal. Nose: normal Throat: normal Neck & thyroid: normal  CHEST & LUNGS: Chest wall: normal Lungs: Clear  CVS: Reveals the PMI to be normally located. Regular rhythm, First and Second Heart sounds are normal,  absence of murmurs, rubs or  gallops. Peripheral vasculature: Radial pulses: normal Dorsal pedis pulses: normal Posterior pulses: normal  ABDOMEN:  Appearance: normal Benign, no organomegaly, no masses, no Abdominal Aortic enlargement. No Guarding , no rebound. No Bruits. Bowel sounds: normal  RECTAL: N/A GU: N/A  EXTREMETIES: nonedematous.  MUSCULOSKELETAL:  Spine: scoliotic and kyphotic Joints: intact  NEUROLOGIC: oriented to person; nonfocal. In the extremities  ASSESSMENT: HYPERTENSION, BORDERLINE  Dementia with behavioral disturbance - Plan: LORazepam (ATIVAN) 0.5 MG tablet, POCT CBC, CMP14+EGFR, TSH  Stool incontinence  Cardiac pacemaker in situ  Atrioventricular block, complete  OCD (obsessive compulsive disorder)  Back pain - Plan: DG Lumbar Spine 2-3 Views, traMADol (ULTRAM) 50 MG tablet  Scoliosis  Vertebral compression fracture  PLAN:  Orders Placed This Encounter  Procedures  . DG Lumbar Spine 2-3 Views    Standing Status: Future     Number of Occurrences: 1     Standing Expiration Date: 02/22/2015    Order Specific Question:  Reason for Exam (SYMPTOM  OR DIAGNOSIS REQUIRED)    Answer:  low back pain. history of vertebral fracture.    Order Specific Question:  Preferred imaging location?    Answer:  Internal  . CMP14+EGFR  . TSH  . POCT CBC  [facility] reading (PRIMARY) by  Dr. Jacelyn Grip: osteopenia, scoliosis, with multilevel degenerative changes and vertebral compressions.                           Discussed with daughter and comfort care is the goal, until he passes on from natural causes.  Meds ordered this encounter  Medications  . LORazepam (ATIVAN) 0.5 MG tablet    Sig: Take 1 tablet (0.5 mg total) by mouth every 8 (eight) hours.    Dispense:  60 tablet    Refill:  2  . traMADol (ULTRAM) 50 MG tablet    Sig: Take 1 tablet (50 mg total) by mouth every 12 (twelve) hours as needed.    Dispense:  60 tablet    Refill:  0   Medications Discontinued During This Encounter   Medication Reason  . LORazepam (ATIVAN) 0.5 MG tablet Reorder  . aspirin 81 MG tablet Completed Course   Return in about 4 weeks (around 01/20/2014) for Recheck medical problems.  Siyon Linck P. Jacelyn Grip, M.D.

## 2013-12-24 LAB — CMP14+EGFR
ALT: 9 IU/L (ref 0–44)
AST: 20 IU/L (ref 0–40)
Albumin/Globulin Ratio: 2.1 (ref 1.1–2.5)
Albumin: 4.1 g/dL (ref 3.5–4.7)
Alkaline Phosphatase: 58 IU/L (ref 39–117)
BUN/Creatinine Ratio: 22 (ref 10–22)
BUN: 19 mg/dL (ref 8–27)
CO2: 23 mmol/L (ref 18–29)
Calcium: 8.7 mg/dL (ref 8.6–10.2)
Chloride: 100 mmol/L (ref 97–108)
Creatinine, Ser: 0.87 mg/dL (ref 0.76–1.27)
GFR calc Af Amer: 91 mL/min/{1.73_m2} (ref >59)
GFR calc non Af Amer: 79 mL/min/{1.73_m2} (ref >59)
Globulin, Total: 2 g/dL (ref 1.5–4.5)
Glucose: 118 mg/dL — ABNORMAL HIGH (ref 65–99)
Potassium: 4.5 mmol/L (ref 3.5–5.2)
Sodium: 138 mmol/L (ref 134–144)
Total Bilirubin: 0.4 mg/dL (ref 0.0–1.2)
Total Protein: 6.1 g/dL (ref 6.0–8.5)

## 2013-12-24 LAB — TSH: TSH: 3.8 u[IU]/mL (ref 0.450–4.500)

## 2013-12-27 ENCOUNTER — Ambulatory Visit: Payer: Medicare Other | Admitting: Family Medicine

## 2013-12-27 ENCOUNTER — Telehealth: Payer: Self-pay | Admitting: Family Medicine

## 2013-12-27 NOTE — Telephone Encounter (Signed)
Message copied by Azalee CourseFULP, ASHLEY on Fri Dec 27, 2013  3:24 PM ------      Message from: Ileana LaddWONG, FRANCIS P      Created: Wed Dec 25, 2013  6:56 PM       Call Patient      Lab result at or close to goal.      No change in Medications for now.      No Change in recommendations.      No change in plans for follow up. ------

## 2013-12-31 NOTE — Telephone Encounter (Signed)
Patient aware.

## 2014-01-15 ENCOUNTER — Ambulatory Visit (INDEPENDENT_AMBULATORY_CARE_PROVIDER_SITE_OTHER): Payer: Medicare Other | Admitting: *Deleted

## 2014-01-15 DIAGNOSIS — I442 Atrioventricular block, complete: Secondary | ICD-10-CM

## 2014-01-16 LAB — MDC_IDC_ENUM_SESS_TYPE_REMOTE
Battery Impedance: 2357 Ohm
Battery Remaining Longevity: 18 mo
Battery Voltage: 2.73 V
Brady Statistic AP VS Percent: 0.4 %
Brady Statistic AS VP Percent: 12.1 %
Brady Statistic AS VS Percent: 0.2 %
Lead Channel Impedance Value: 448 Ohm
Lead Channel Impedance Value: 455 Ohm
Lead Channel Pacing Threshold Amplitude: 0.75 V
Lead Channel Pacing Threshold Amplitude: 0.75 V
Lead Channel Pacing Threshold Pulse Width: 0.4 ms
Lead Channel Setting Pacing Amplitude: 2 V
Lead Channel Setting Pacing Amplitude: 2.5 V
Lead Channel Setting Pacing Pulse Width: 0.4 ms
Lead Channel Setting Sensing Sensitivity: 2 mV
MDC IDC MSMT LEADCHNL RV PACING THRESHOLD PULSEWIDTH: 0.4 ms
MDC IDC STAT BRADY AP VP PERCENT: 87.3 %

## 2014-01-21 ENCOUNTER — Encounter: Payer: Self-pay | Admitting: Family Medicine

## 2014-01-21 ENCOUNTER — Ambulatory Visit (INDEPENDENT_AMBULATORY_CARE_PROVIDER_SITE_OTHER): Payer: Medicare Other | Admitting: Family Medicine

## 2014-01-21 VITALS — BP 103/53 | HR 68 | Temp 98.7°F | Ht 69.0 in | Wt 135.8 lb

## 2014-01-21 DIAGNOSIS — F0391 Unspecified dementia with behavioral disturbance: Secondary | ICD-10-CM

## 2014-01-21 DIAGNOSIS — F03918 Unspecified dementia, unspecified severity, with other behavioral disturbance: Secondary | ICD-10-CM

## 2014-01-21 DIAGNOSIS — R159 Full incontinence of feces: Secondary | ICD-10-CM

## 2014-01-21 DIAGNOSIS — M549 Dorsalgia, unspecified: Secondary | ICD-10-CM

## 2014-01-21 DIAGNOSIS — M419 Scoliosis, unspecified: Secondary | ICD-10-CM

## 2014-01-21 DIAGNOSIS — M4850XA Collapsed vertebra, not elsewhere classified, site unspecified, initial encounter for fracture: Secondary | ICD-10-CM

## 2014-01-21 DIAGNOSIS — Z950 Presence of cardiac pacemaker: Secondary | ICD-10-CM

## 2014-01-21 DIAGNOSIS — I10 Essential (primary) hypertension: Secondary | ICD-10-CM

## 2014-01-21 DIAGNOSIS — IMO0002 Reserved for concepts with insufficient information to code with codable children: Secondary | ICD-10-CM

## 2014-01-21 DIAGNOSIS — M4120 Other idiopathic scoliosis, site unspecified: Secondary | ICD-10-CM

## 2014-01-21 MED ORDER — TRAMADOL HCL 50 MG PO TABS: 50.0000 mg | ORAL_TABLET | Freq: Four times a day (QID) | ORAL | Status: AC | PRN

## 2014-01-21 NOTE — Progress Notes (Signed)
Patient ID: Warren Ritter, male   DOB: 02/21/1926, 80 y.o.   MRN: [id number] SUBJECTIVE: CC: Chief Complaint  Patient presents with  . Follow-up    4 WEEK  FOLLOW UP  WANTS HANDICAP FORM COMPLETED    HPI: Here for  Recheck of pain control. Patient has scoliosis with worsening vertebral compression fracture . Has had progressive dementia. He also has lost some weight. His daughter who is his POA and caretaker advises the wish of comfort care and a natural death.  here for recheck. The daughter believes that he is doing better with pain medication tramadol howver some days needs 3 for the day. Also his mobility is slowing down and needs a handicap sticker.  Past Medical History  Diagnosis Date  . Dementia   . Stool incontinence   . Hypertension   . OCD (obsessive compulsive disorder)   . Cardiac pacemaker   . Heart block AV third degree   . MRSA (methicillin resistant staph aureus) culture positive    Past Surgical History  Procedure Laterality Date  . Hernia repair      inguinal  . Implantation of a medtronic dual-chamber pacemaker  10/05/2007    Dr. Lewayne BuntingGregg Taylor   History   Social History  . Marital Status: Widowed    Spouse Name: N/A    Number of Children: 1  . Years of Education: N/A   Occupational History  . Retired    Social History Main Topics  . Smoking status: Never Smoker   . Smokeless tobacco: Never Used  . Alcohol Use: No  . Drug Use: No  . Sexual Activity: Not on file   Other Topics Concern  . Not on file   Social History Narrative  . No narrative on file   Family History  Problem Relation Age of Onset  . Diabetes Mother   . Kidney disease Mother   . Prostate cancer Father   . Prostate cancer Brother   . Diabetes Brother   . Heart disease Brother   . Diabetes Sister    Current Outpatient Prescriptions on File Prior to Visit  Medication Sig Dispense Refill  . amLODipine (NORVASC) 5 MG tablet TAKE ONE TABLET BY MOUTH ONE TIME DAILY  90 tablet  2   . DimenhyDRINATE (DRAMAMINE PO) Take by mouth.      . Loperamide HCl (IMODIUM PO) Take by mouth as needed.        Marland Kitchen. LORazepam (ATIVAN) 0.5 MG tablet Take 1 tablet (0.5 mg total) by mouth every 8 (eight) hours.  60 tablet  2   No current facility-administered medications on file prior to visit.   No Known Allergies Immunization History  Administered Date(s) Administered  . Influenza, High Dose Seasonal PF 08/06/2013  . Tdap 10/17/2004   Prior to Admission medications   Medication Sig Start Date End Date Taking? Authorizing Provider  amLODipine (NORVASC) 5 MG tablet TAKE ONE TABLET BY MOUTH ONE TIME DAILY 11/16/13  Yes Marinus MawGregg W Taylor, MD  DimenhyDRINATE (DRAMAMINE PO) Take by mouth.   Yes Historical Provider, MD  Loperamide HCl (IMODIUM PO) Take by mouth as needed.     Yes Historical Provider, MD  LORazepam (ATIVAN) 0.5 MG tablet Take 1 tablet (0.5 mg total) by mouth every 8 (eight) hours. 12/23/13  Yes Ileana LaddFrancis P Fe[place] Lemaster, MD  traMADol (ULTRAM) 50 MG tablet Take 1 tablet (50 mg total) by mouth every 12 (twelve) hours as needed. 12/23/13  Yes Ileana LaddFrancis P Amarien Carne, MD  ROS: As above in the HPI. All other systems are stable or negative.  OBJECTIVE: APPEARANCE:  Patient in no acute distress.The patient appeared well nourished and normally developed. Acyanotic. Waist: VITAL SIGNS:BP 103/53  Pulse 68  Temp(Src) 98.7 F (37.1 C) (Oral)  Ht 5\' 9"  (1.753 m)  Wt 135 lb 12.8 oz (61.598 kg)  BMI 20.04 kg/m2 Elderly WM   SKIN: warm and  Dry without overt rashes, tattoos and scars  HEAD and Neck: without JVD, Head and scalp: normal Eyes:No scleral icterus. Fundi normal, eye movements normal. Ears: Auricle normal, canal normal, Tympanic membranes normal, insufflation normal. Nose: normal Throat: normal Neck & thyroid: normal  CHEST & LUNGS: Chest wall: normal Lungs: Clear  CVS: Reveals the PMI to be normally located. Regular rhythm, First and Second Heart sounds are normal,  absence of  murmurs, rubs or gallops. Peripheral vasculature: Radial pulses: normal Dorsal pedis pulses: normal Posterior pulses: normal  ABDOMEN:  Appearance: normal Benign, no organomegaly, no masses, no Abdominal Aortic enlargement. No Guarding , no rebound. No Bruits. Bowel sounds: normal  RECTAL: N/A GU: N/A  EXTREMETIES: nonedematous.  MUSCULOSKELETAL:  Spine:kyphosis. Reduced ROM.  NEUROLOGIC: oriented to person; nonfocal in extremities. Gait slow  ASSESSMENT:  HYPERTENSION, BORDERLINE  Dementia with behavioral disturbance  Vertebral compression fracture  Scoliosis  Stool incontinence  Cardiac pacemaker in situ  Back pain - Plan: traMADol (ULTRAM) 50 MG tablet Pain controlled satisfactorily. The stool incontinence is not new.  PLAN: Continue comfort care and adjusted the tramadol for coverage of pain control.  Suggested puree his meals. Since his dentition is slowly deteriorating and he is not cooperating with the dentist.  No orders of the defined types were placed in this encounter.   Meds ordered this encounter  Medications  . traMADol (ULTRAM) 50 MG tablet    Sig: Take 1 tablet (50 mg total) by mouth every 6 (six) hours as needed.    Dispense:  90 tablet    Refill:  0   Medications Discontinued During This Encounter  Medication Reason  . traMADol (ULTRAM) 50 MG tablet Reorder   Return in about 3 months (around 04/22/2014) for Recheck medical problems.  Torii Royse P. [place] Charon, M.D.

## 2014-01-22 ENCOUNTER — Telehealth: Payer: Self-pay | Admitting: *Deleted

## 2014-01-22 ENCOUNTER — Encounter: Payer: Self-pay | Admitting: *Deleted

## 2014-01-22 NOTE — Telephone Encounter (Signed)
Pt called in at 430 pm stating that pt had had a lot diarrhea and was scared to increase tramadol (tonight would be the increased dose) I told them to take imodium otc as directed and wong or nurse would address this on Thursday.

## 2014-01-23 NOTE — Telephone Encounter (Signed)
Tried to contact patient but phone was busy.

## 2014-01-23 NOTE — Telephone Encounter (Signed)
Call daughter to see how the diarrhea is doing today. If still has a trickle then she needs to check him for impaction.

## 2014-01-30 ENCOUNTER — Encounter: Payer: Self-pay | Admitting: Internal Medicine

## 2014-01-30 NOTE — Telephone Encounter (Signed)
Spoke with Darel HongJudy and he is back on tramadol and still having diarrhea but she says he has had diarrhea for 3 1/2 years and per Darel HongJudy "he is back go baseline normal"  Advised to call prn

## 2014-02-10 ENCOUNTER — Other Ambulatory Visit: Payer: Self-pay | Admitting: Family Medicine

## 2014-02-10 ENCOUNTER — Telehealth: Payer: Self-pay | Admitting: Family Medicine

## 2014-02-10 DIAGNOSIS — F03918 Unspecified dementia, unspecified severity, with other behavioral disturbance: Secondary | ICD-10-CM

## 2014-02-10 DIAGNOSIS — F0391 Unspecified dementia with behavioral disturbance: Secondary | ICD-10-CM

## 2014-02-19 ENCOUNTER — Telehealth: Payer: Self-pay | Admitting: Internal Medicine

## 2014-02-19 NOTE — Telephone Encounter (Signed)
PT'S DTR CALLED TO LET DR Ladona RidgelAYLOR KNOW HE PASSED AWAY ON 09-Sep-2014. SHE WANTED TO THANK YOU FOR ALL YOUR KINDNESS AND ALWAYS ANSWERING ALL OF HER QUESTIONS, IF YOU NEED TO CALL HER SHE SAID YOU CAN REACH HER AT [phone]

## 2014-02-21 NOTE — Telephone Encounter (Signed)
Darel HongJudy aware of referral being initiated.

## 2014-02-21 NOTE — Telephone Encounter (Signed)
Referral done in [facility]. 

## 2014-02-21 DEATH — deceased

## 2014-07-10 IMAGING — CR DG ABDOMEN 1V
1 series · 1 of 1 positions shown · non-contrast
Comparison: None.

CLINICAL DATA: Chronic diarrhea

ABDOMEN - 1 VIEW

[view not recorded]
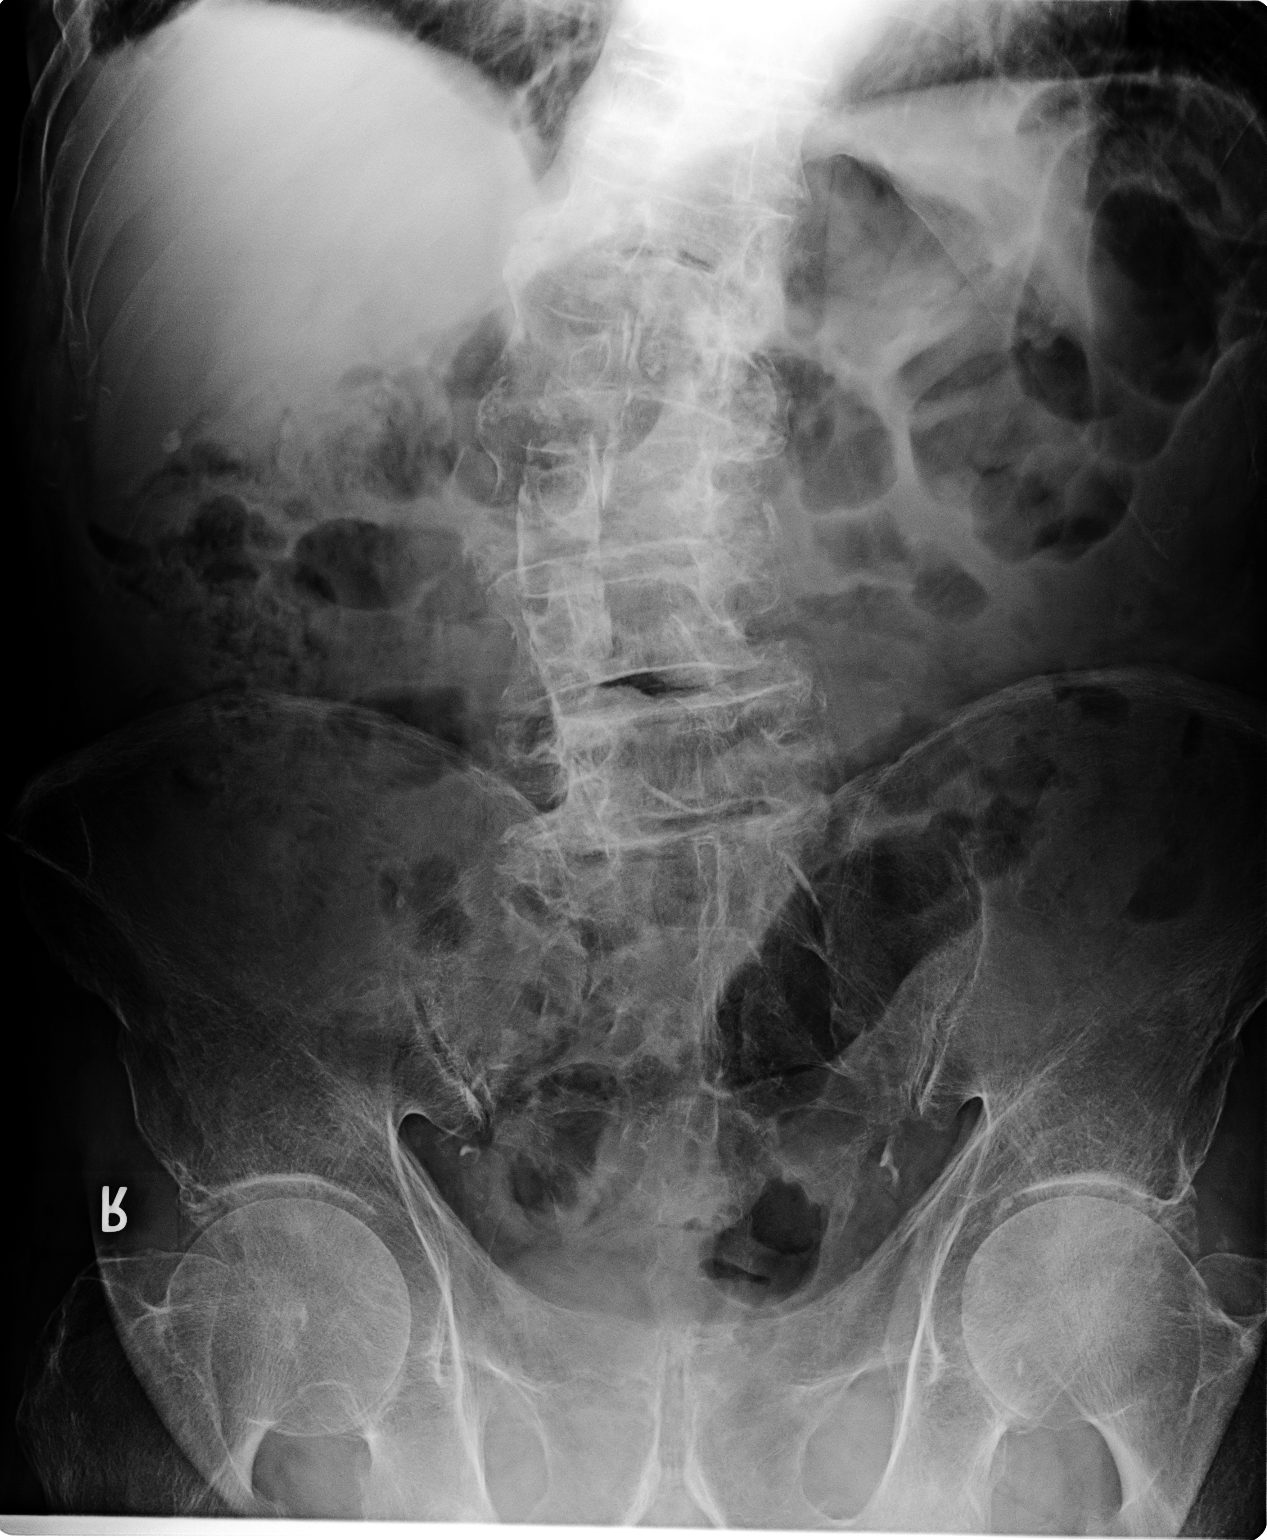

[1 of 1 positions shown; findings below may reference images not displayed]

FINDINGS: Nonobstructed bowel gas pattern.  Colonic stool burden is
within normal limits.  The aorto iliac atherosclerotic vascular
calcifications are noted.  The bones diffusely osteopenic.  There
is marked dextroconvex scoliosis of the lumbar spine with probable
compression fractures at T12, L1 and L2.
IMPRESSION: 1. Negative for obstruction.
2.  Unremarkable colonic stool burden.
3.  Probable thoracolumbar compression fractures with resultant
dextroconvex scoliosis centered at L2-L3.
4.  Osteopenia

Clinically significant discrepancy from primary report, if
provided: None

## 2014-07-31 IMAGING — CT CT ABD-PELV W/ CM
2 of 5 series · 16 of 46 positions shown, 18 images · IV contrast (Omnipaque 300)
Comparison: One-view abdomen 04/17/2013.

CLINICAL DATA: Diarrhea and abdominal pain.

CT ABDOMEN AND PELVIS WITH CONTRAST
TECHNIQUE: Multidetector CT imaging of the abdomen and pelvis was
performed following the standard protocol during bolus
administration of intravenous contrast.
Contrast: 100mL OMNIPAQUE IOHEXOL 300 MG/ML  SOLN

[Series 2: abd_pel_with 5.0 b40f · axial · 0.69mm/px · z∈[-375,-5]mm · 13 of 84 slices shown, 15 images]
[im 5/84  soft-tissue]
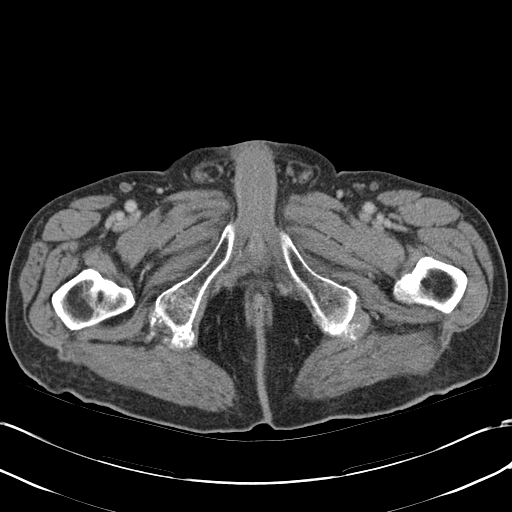
[im 5/84  bone]
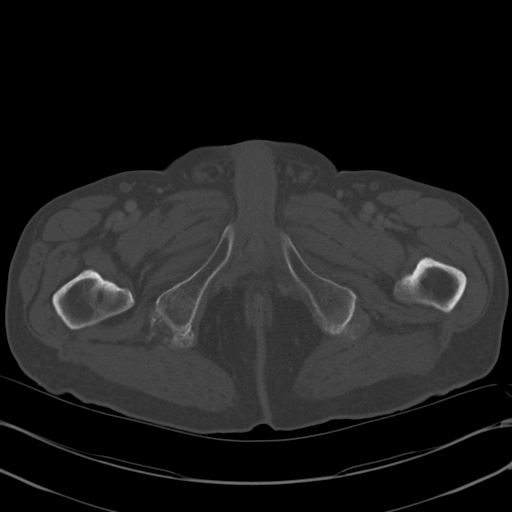
[im 10/84  soft-tissue]
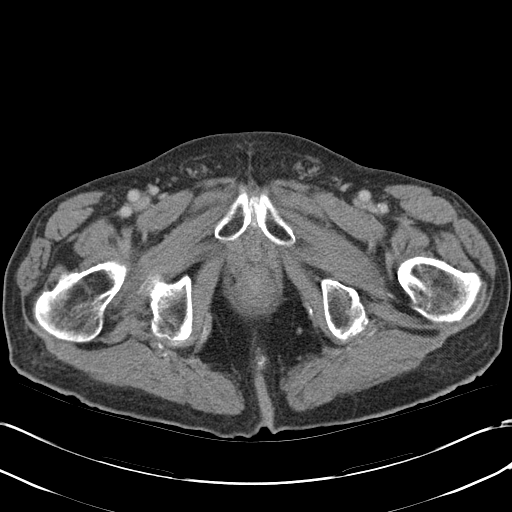
[im 19/84  soft-tissue]
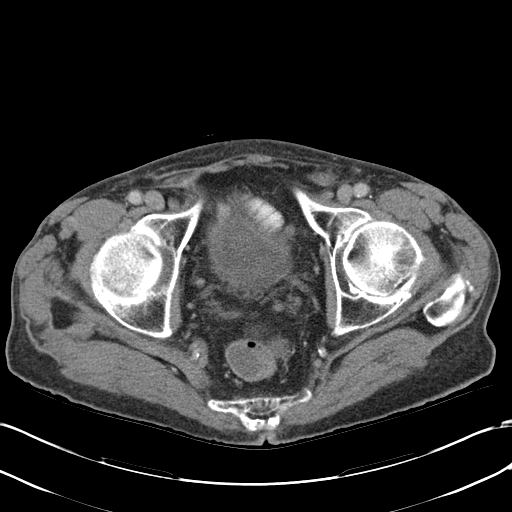
[im 24/84  soft-tissue]
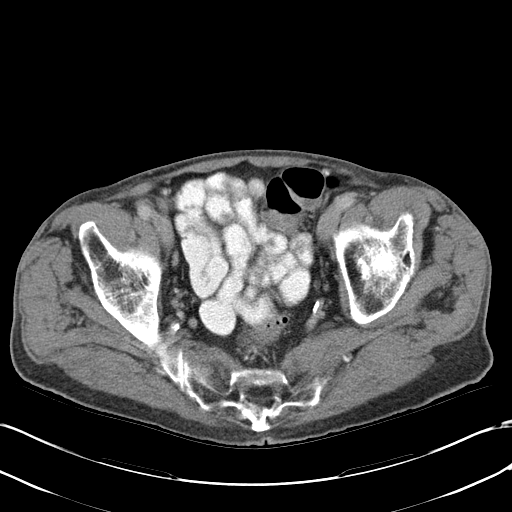
[im 28/84  soft-tissue]
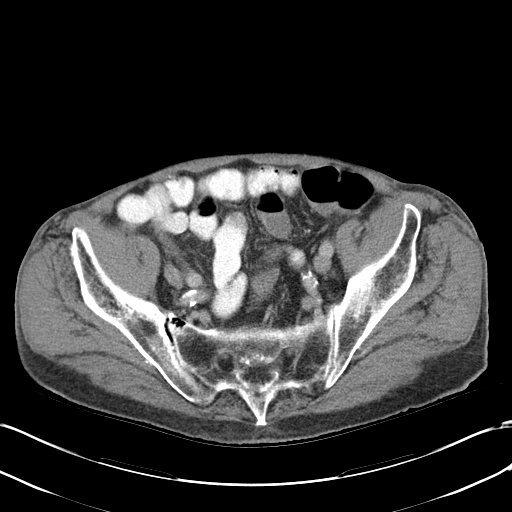
[im 37/84  soft-tissue]
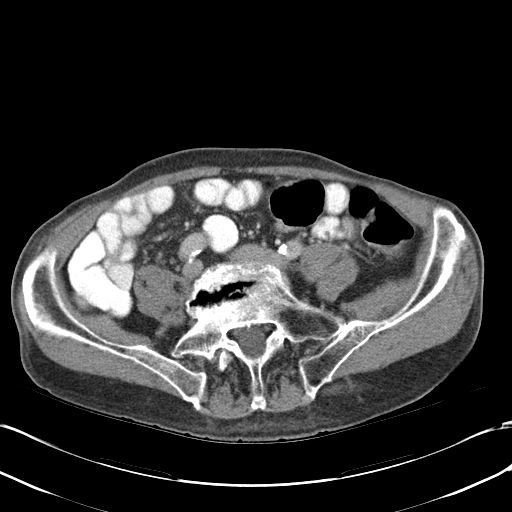
[im 42/84  soft-tissue]
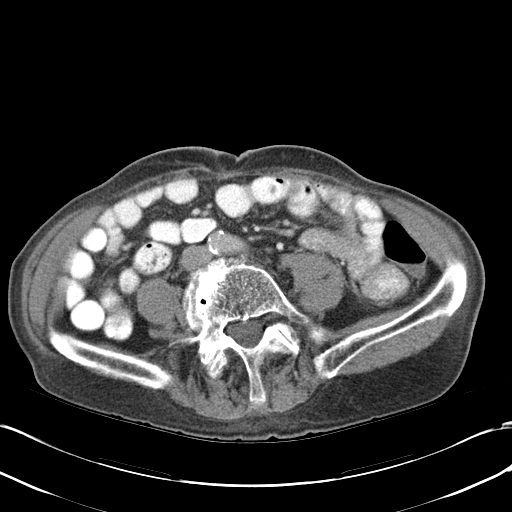
[im 47/84  soft-tissue]
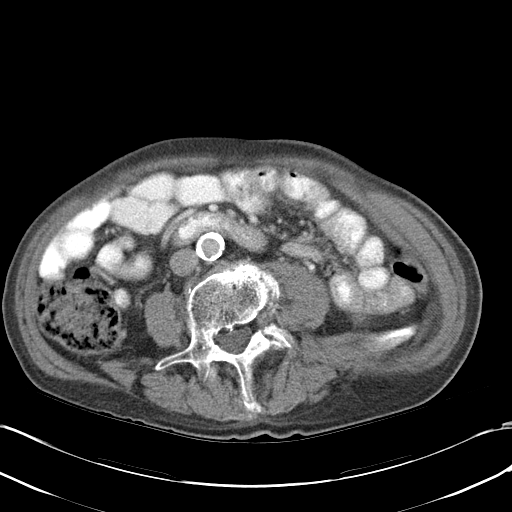
[im 56/84  soft-tissue]
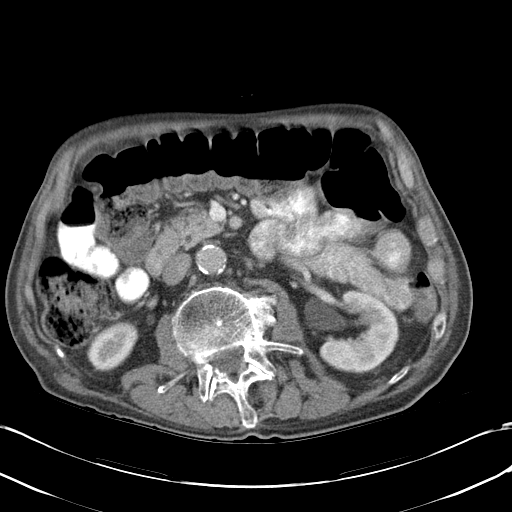
[im 56/84  bone]
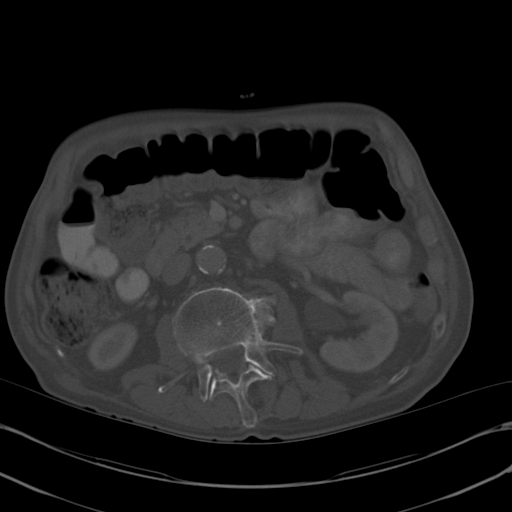
[im 60/84  soft-tissue]
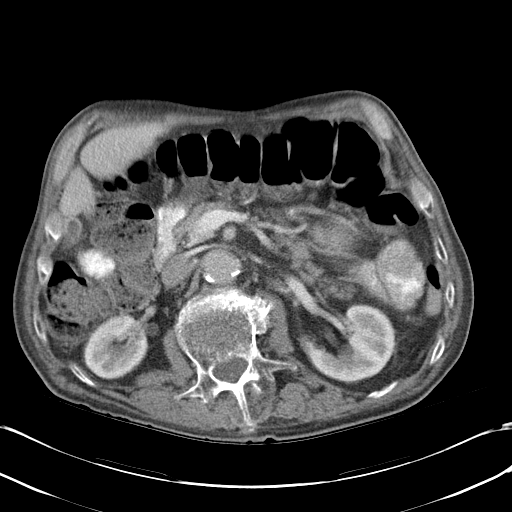
[im 65/84  soft-tissue]
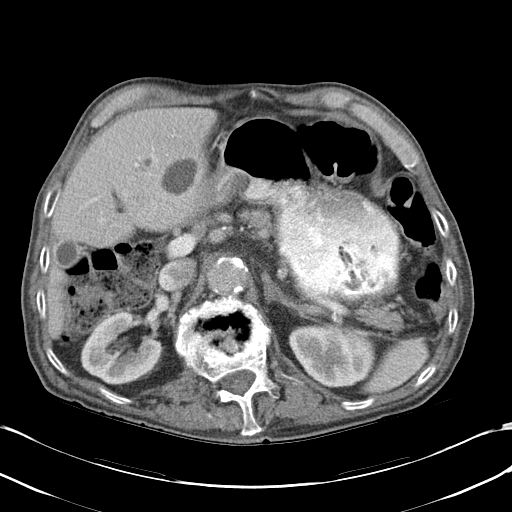
[im 74/84  soft-tissue]
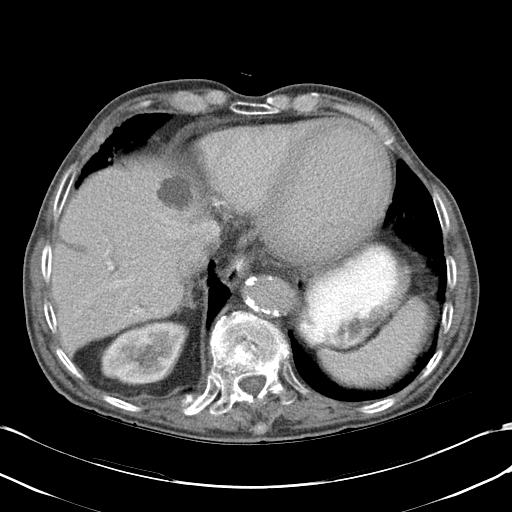
[im 79/84  soft-tissue]
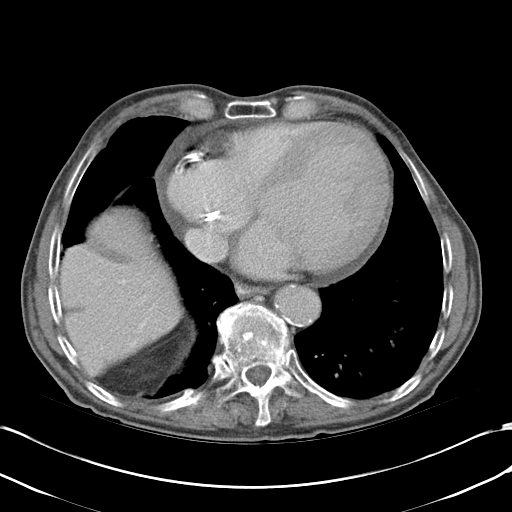

[Series 4: abd_pel_with 3.0 spo · coronal · 0.66mm/px · 3 of 81 slices shown]
[im 27/81  soft-tissue]
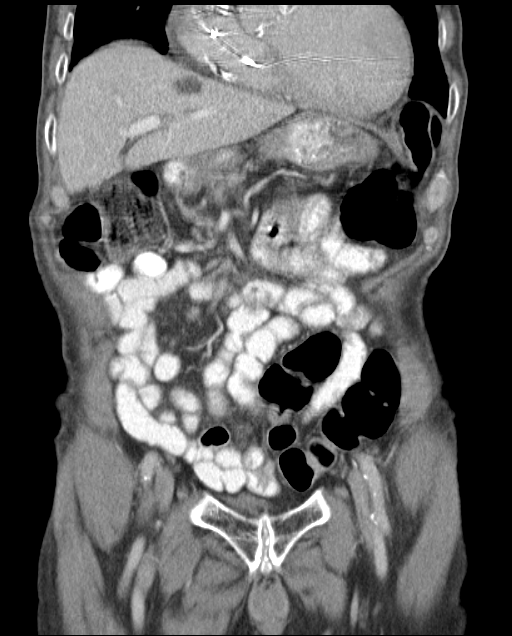
[im 36/81  soft-tissue]
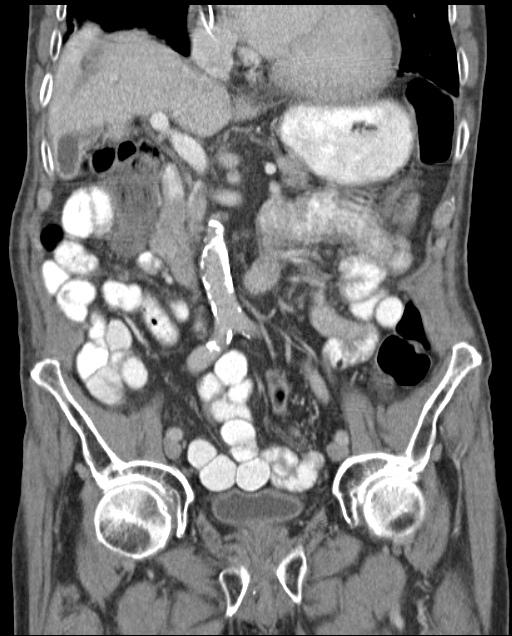
[im 45/81  soft-tissue]
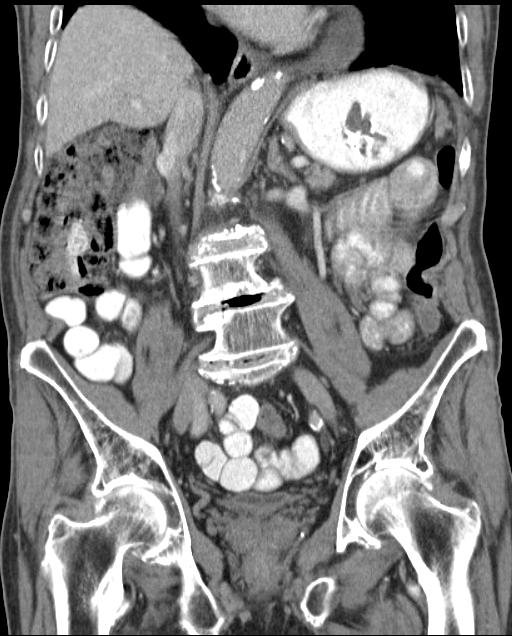

[16 of 46 positions shown; findings below may reference images not displayed]

FINDINGS: Mild dependent atelectasis is present.  The heart is
enlarged.  Pacing wires are in place.  Coronary artery
calcifications are present.  A small pericardial effusion is
present.  There is no significant pleural effusion.

Multiple hepatic cysts are evident.  The spleen is within normal
limits.  The stomach, duodenum, and pancreas are unremarkable.  The
common bile duct and gallbladder are within normal limits.  The
adrenal glands are normal bilaterally.  The kidneys and ureters are
unremarkable.

Fluid is evident in the distal rectosigmoid colon compatible with
diarrhea.  The more proximal colon is unremarkable.  There is stool
in the cecum.  The small bowel is within normal limits.  No
significant adenopathy or free fluid is present. Extensive
atherosclerotic calcifications are present.

The bone windows demonstrate a rightward curvature of the lumbar
spine, centered at L2.  Compression fractures at L1 and T12 do not
appear completely healed.  These are new since 7446.
IMPRESSION: 1.
1.  Cardiomegaly and small pericardial effusion without to evidence
for failure.
2.  Extensive atherosclerotic changes.
3.  Fluid in the distal colon compatible with diarrhea.  The colon
is otherwise unremarkable.
4.  Scoliosis.
5.  T12 and L1 compression fractures may not be completely healed.
Please correlate with location of symptoms.
6.  Multiple hepatic cysts appear benign.
